# Patient Record
Sex: Female | Born: 2000
Health system: Southern US, Community
[De-identification: ages and names within clinical notes are randomized; demographics above are authoritative.]

## PROBLEM LIST (undated history)

## (undated) DIAGNOSIS — K5900 Constipation, unspecified: Secondary | ICD-10-CM

## (undated) DIAGNOSIS — L929 Granulomatous disorder of the skin and subcutaneous tissue, unspecified: Secondary | ICD-10-CM

## (undated) DIAGNOSIS — K219 Gastro-esophageal reflux disease without esophagitis: Secondary | ICD-10-CM

## (undated) HISTORY — DX: Gastro-esophageal reflux disease without esophagitis: K21.9

## (undated) HISTORY — PX: TONSILLECTOMY: SUR1361

## (undated) HISTORY — DX: Constipation, unspecified: K59.00

## (undated) HISTORY — PX: ADENOIDECTOMY: SUR15

## (undated) HISTORY — PX: TYMPANOSTOMY TUBE PLACEMENT: SHX32

---
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
## 2020-01-03 DIAGNOSIS — R4681 Obsessive-compulsive behavior: Secondary | ICD-10-CM | POA: Diagnosis not present

## 2020-01-03 DIAGNOSIS — F411 Generalized anxiety disorder: Secondary | ICD-10-CM | POA: Diagnosis not present

## 2020-01-04 ENCOUNTER — Ambulatory Visit: Payer: Self-pay | Admitting: Podiatry

## 2020-01-07 DIAGNOSIS — H527 Unspecified disorder of refraction: Secondary | ICD-10-CM | POA: Diagnosis not present

## 2020-01-08 ENCOUNTER — Encounter: Payer: Self-pay | Admitting: Psychiatry

## 2020-01-08 ENCOUNTER — Other Ambulatory Visit: Payer: Self-pay

## 2020-01-08 ENCOUNTER — Ambulatory Visit (INDEPENDENT_AMBULATORY_CARE_PROVIDER_SITE_OTHER): Payer: BC Managed Care – PPO | Admitting: Psychiatry

## 2020-01-08 VITALS — Ht 66.0 in | Wt 138.0 lb

## 2020-01-08 DIAGNOSIS — F422 Mixed obsessional thoughts and acts: Secondary | ICD-10-CM

## 2020-01-08 DIAGNOSIS — F902 Attention-deficit hyperactivity disorder, combined type: Secondary | ICD-10-CM | POA: Diagnosis not present

## 2020-01-08 DIAGNOSIS — F952 Tourette's disorder: Secondary | ICD-10-CM

## 2020-01-08 MED ORDER — QUETIAPINE FUMARATE 25 MG PO TABS: 25.0000 mg | ORAL_TABLET | Freq: Every day | ORAL | 1 refills | Status: DC

## 2020-01-08 MED ORDER — ESCITALOPRAM OXALATE 5 MG/5ML PO SOLN: 30.0000 mg | Freq: Every day | ORAL | 1 refills | Status: DC

## 2020-01-08 NOTE — Progress Notes (Addendum)
[facility]  Patient ID: Kim [name],  MRN: [id number]  PCP: Monna Fam, MD  Date of Evaluation: 01/08/2020 Time spent:35 minutes from 1650 to 1725   Chief Complaint:   HISTORY/CURRENT STATUS: Kim [name] is seen Onsite 35 minutes face-to-face conjointly with father with consent with epic collateral for 36-month evaluation and management of OCD/ADHD/Tourette reporting she may be depressed or generally anxious.  Symptoms through the past have been undermining of high school completion and rewarding social life.  Tics have been severe at times particularly facial motor tics in the past patient denies worried socially seeming to repress and suppressed rather than opening up and discussing problems for solutions.  As of last appointment she was fed up with the treatment process and high school [employer] graduation having a tough senior year with 6 courses in order to graduate.  She concluded to take a 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  Patient refused to come back earlier than 1 year taking only her Lexapro.  She was dissatisfied previously with methylphenidate in various forms, Zoloft, Adderall, Remeron, Intuniv, Strattera, and Wellbutrin finding that only the Lexapro solution was satisfactory taking 30 mg daily for OCD and that medicines either made things worse or did not help at all obviously having difficulty with change also..  She now presents with father seeming to still be debating her ability to leave in August for [employer] in [place] 1 hour outside the city patient stating she is confident that she is going to make it there but stating at the same time she feels somewhat depressed and anxious in way she cannot clarify.  Therefore, she looks to father to do something but father has OCD and finds change very difficult.   I can discuss all the potential medication approaches to complex OCD but not gain from them any comfort or confidence. We particularly realizedthat she has 2 months to get ready to be in [place] on her own and she is not realistically looking at difficulty separating from father who may find it difficult.  She has no mania, suicidality, psychosis or delirium.  She does not manifest depression in the session or more than situational generalized anxiety, but her pathology for the complex OCD is significant.  Patient had extended therapy with [name], [facility] in the past on a weekly basis and discontinued though possibly she would consider resuming if she and father could collaborate on it.   Individual Medical History/ Review of Systems: Changes? :No Weight is stable over time unchanged from 10 months ago.  Allergies: Patient has no known allergies.  Current Medications:  Current Outpatient Medications:  .  amoxicillin-clavulanate (AUGMENTIN) 400-57 MG chewable tablet, Chew 1 tablet by mouth 2 (two) times daily., Disp: 28 tablet, Rfl: 0 .  atomoxetine (STRATTERA) 25 MG capsule, Take 1 capsule (25 mg total) by mouth daily., Disp: 30 capsule, Rfl: 1 .  azelastine (ASTELIN) 0.1 % nasal spray, Place 1 spray into both nostrils 2 (two) times daily. Use in each nostril as directed, Disp: 30 mL, Rfl: 0 .  b complex vitamins tablet, Take 1 tablet by mouth daily., Disp: , Rfl:  .  brompheniramine-pseudoephedrine-DM 30-2-10 MG/5ML syrup, Take 10 mLs by mouth 3 (three) times daily as needed., Disp: 120 mL, Rfl: 0 .  doxycycline (VIBRAMYCIN) 50 MG/5ML SYRP,  Take 10 mLs (100 mg total) by mouth 2 (two) times daily., Disp: 200 mL, Rfl: 0 .  escitalopram (LEXAPRO) 5 MG/5ML solution, Take 30 mLs (30 mg total) by mouth daily after breakfast., Disp: 960 mL, Rfl: 5 .  fluorouracil (EFUDEX) 5 % cream, Apply topically 2 (two) times daily. For wart after blister and antibiotic use, Disp: 40 g, Rfl: 0 .  ondansetron  (ZOFRAN-ODT) 4 MG disintegrating tablet, TAKE 1 TABLET BY MOUTH EVERY 4-6 HOURS AS NEEDED, Disp: , Rfl: 0 .  Polyethylene Glycol 3350 (MIRALAX PO), Take by mouth., Disp: , Rfl:  .  PREVIDENT 5000 BOOSTER PLUS 1.1 % PSTE, USE 1 TO 2 TIMES A DAY FOR BRUSHING, Disp: , Rfl: 6 .  propranolol (INDERAL) 10 MG tablet, TAKE 1 TABLET BY MOUTH TWICE A DAY, Disp: 60 tablet, Rfl: 4  Medication Side Effects: none  Family Medical/ Social History: Changes? Yes father offers that he took Seroquel in the past but provides no other information about efficacy or any adverse effects.  MENTAL HEALTH EXAM:  Height 5\' 6"  (1.676 m), weight 138 lb (62.6 kg).Body mass index is 22.27 kg/m. Muscle strengths and tone 5/5, postural reflexes and gait 0/0, and AIMS = 0 with no motor tics today off all ADHD meds  General Appearance: Casual, Fairly Groomed, Guarded and Meticulous  Eye Contact:  Fair  Speech:  Clear and Coherent and Normal Rate  Volume:  Normal  Mood:  Anxious, Dysphoric, Euthymic and Irritable  Affect:  Congruent, Inappropriate, Full Range and Anxious  Thought Process:  Coherent, Goal Directed, Irrelevant, Linear and Descriptions of Associations: Circumstantial  Orientation:  Full (Time, Place, and Person)  Thought Content: Logical, Ilusions, Obsessions and Rumination   Suicidal Thoughts:  No  Homicidal Thoughts:  No  Memory:  Immediate;   Good Remote;   Good  Judgement:  Impaired to Fair  Insight:  Lacking to Fair  Psychomotor Activity:  Normal, Increased and Mannerisms  Concentration:  Concentration: Fair and Attention Span: Fair  Recall:  [employer] of Knowledge: Good  Language: Fair  Assets:  Leisure Time Resilience Talents/Skills  ADL's:  Intact  Cognition: WNL  Prognosis:  Fair    DIAGNOSES:    ICD-10-CM   1. Mixed obsessional thoughts and acts  F42.2   2. Attention deficit hyperactivity disorder (ADHD), combined type, moderate  F90.2   3. Tourette's disorder  F95.2     Receiving  Psychotherapy: No    RECOMMENDATIONS: [place] nonverbally is controlling of father's emotional response to treatment need, and father is not able to response prevent or thought control to make a family decision on the best next step for patient's difficulty getting ready for college.  Kim [name] will not answer for herself despite taking a year off being out of high school preparing for going to college stating that nothing will keep her from going when she unfortunately is building a reason for her father to not allow her to go.  We attempt to facilitate her ability to form a hierarchy of these problems that can then be solved in a graduated fashion.  Exposure desensitization thought stopping habit reversal response prevention is targeted to the patient's fixation in her preparation for going to college, her closure of household function integrated between she and father, and her independence in anticipated college social life, daily responsibilities for ADLs and learning based activities, and confidence for decision-making surrounding time and place for leisure and work.  We discuss options such as low-dose Anafranil  added to current Lexapro especially is she insists she is more depressed than can be objecitvely verified, BuSpar augmentation of Lexapro, Klonopin augmentation of Lexapro, change of Lexapro such as to Pristiq, and augmenting addition of Seroquel low-dose or a low to moderate potency dopamine blocker such as Zyprexa Zydis to the Lexapro.  With the tic disorder limiting treatment for ADHD, I suggest that Seroquel 25 mg every bedtime be started at bedtime along with continuing Lexapro 30 mg as 30 cc of the 1 mg/cc, E scribed to [facility] as a 88-month supply 2700 cc with 1 refill of Lexapro solution and #90 with 1 refill of Seroquel 25 mg tablets as she has difficulty with swallowing larger tablets for OCD/ADHD/Tourette syndrome.  I suggest returning in 6 weeks before leaving for college for any final  adjustments..  They understand prevention and monitoring safety hygiene with no crisis plans required currently though previously reviewed attempting to keep anxious confusion, disappointment over separation, and overstimulation to a minimum.  Delight Hoh, MD

## 2020-01-09 ENCOUNTER — Ambulatory Visit (INDEPENDENT_AMBULATORY_CARE_PROVIDER_SITE_OTHER): Payer: Self-pay | Admitting: Podiatry

## 2020-01-09 DIAGNOSIS — L600 Ingrowing nail: Secondary | ICD-10-CM

## 2020-01-11 ENCOUNTER — Encounter: Payer: Self-pay | Admitting: Podiatry
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
## 2020-01-17 DIAGNOSIS — R4681 Obsessive-compulsive behavior: Secondary | ICD-10-CM | POA: Diagnosis not present

## 2020-01-17 DIAGNOSIS — F411 Generalized anxiety disorder: Secondary | ICD-10-CM | POA: Diagnosis not present

## 2020-01-24 DIAGNOSIS — F411 Generalized anxiety disorder: Secondary | ICD-10-CM | POA: Diagnosis not present

## 2020-01-24 DIAGNOSIS — R4681 Obsessive-compulsive behavior: Secondary | ICD-10-CM | POA: Diagnosis not present

## 2020-02-12 ENCOUNTER — Ambulatory Visit: Payer: BC Managed Care – PPO | Admitting: Psychiatry

## 2020-02-14 DIAGNOSIS — F411 Generalized anxiety disorder: Secondary | ICD-10-CM | POA: Diagnosis not present

## 2020-02-14 DIAGNOSIS — R4681 Obsessive-compulsive behavior: Secondary | ICD-10-CM | POA: Diagnosis not present

## 2020-02-20 ENCOUNTER — Encounter: Payer: Self-pay | Admitting: Psychiatry

## 2020-02-20 ENCOUNTER — Ambulatory Visit (INDEPENDENT_AMBULATORY_CARE_PROVIDER_SITE_OTHER): Payer: BC Managed Care – PPO | Admitting: Psychiatry

## 2020-02-20 ENCOUNTER — Other Ambulatory Visit: Payer: Self-pay

## 2020-02-20 DIAGNOSIS — F902 Attention-deficit hyperactivity disorder, combined type: Secondary | ICD-10-CM

## 2020-02-20 DIAGNOSIS — F422 Mixed obsessional thoughts and acts: Secondary | ICD-10-CM

## 2020-02-20 DIAGNOSIS — F952 Tourette's disorder: Secondary | ICD-10-CM

## 2020-02-20 MED ORDER — ARIPIPRAZOLE 1 MG/ML PO SOLN: 10.0000 mg | Freq: Every day | ORAL | 1 refills | Status: DC

## 2020-02-20 NOTE — Progress Notes (Signed)
Kim [name] [id number] 06/15/2001 19 y.o.  Subjective:   Patient ID:  Kim [name] is a 19 y.o. (DOB 29-Oct-2000) female.  Chief Complaint:  Chief Complaint  Patient presents with  . Follow-up  . Anxiety  . Depression    HPI Kim [name] presents to the office today for follow-up of OCD, ADD, depression Seen with mother [name] and father [name].  Rob needing psych help but gave up his appt for daughter to be seen before going to school.  She's going to [place] to school. She has OCD, ADD, Tourette's and depression.  They wonder about other med changes. She needs liquids bc anxiety swallowing pills. Pt doesn't drive DT anxiety.  Would like help with OCD.  Hesitant about ADD meds but poor tolerance with anxiety. Contamination fears with handwashing.  Germs in general and fear of vomtiing so hard to take pills. Handwashes time hard to say but a couple of minutes.  Irritating time.   Triggers include cleaning.   She will avoid hospitals and in general doctors offices. Mo concerned OCD takes a lot of time and attention. She recognizes OCD started in middle school. Parents in support of her going to school  Depressed but not severe.  Not a direct consequence of OCD.  Feels exhausted and not motivated. Sleep about 9 hours.  Energy OK.  Not much to concentrate on.  Hard to read. ADD lack of focus is a problem on reading and is a concern for school.   Had racing heart before stimulants and they made it worse. Can get this with anxiety and get dizzy.  Leaves for school 8/31.  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  Past Psychiatric Medication Trials: History of Tourette's, ADD, OCD History of ADD brief trials Adderall XR 15, guanfacine ER 1 tired, methylphenidate, Strattera No modafinil. Lexapro 30, sertraline brief hard to swallow, mirtazapine, Wellbutrin  Review of Systems:  Review of Systems  Cardiovascular: Negative for  chest pain and palpitations.  Gastrointestinal: Positive for nausea.  Neurological: Negative for tremors and weakness.    Medications: I have reviewed the patient's current medications.  Current Outpatient Medications  Medication Sig Dispense Refill  . amoxicillin-clavulanate (AUGMENTIN) 400-57 MG chewable tablet Chew 1 tablet by mouth 2 (two) times daily. 28 tablet 0  . azelastine (ASTELIN) 0.1 % nasal spray Place 1 spray into both nostrils 2 (two) times daily. Use in each nostril as directed 30 mL 0  . b complex vitamins tablet Take 1 tablet by mouth daily.    . brompheniramine-pseudoephedrine-DM 30-2-10 MG/5ML syrup Take 10 mLs by mouth 3 (three) times daily as needed. 120 mL 0  . doxycycline (VIBRAMYCIN) 50 MG/5ML SYRP Take 10 mLs (100 mg total) by mouth 2 (two) times daily. 200 mL 0  . escitalopram (LEXAPRO) 5 MG/5ML solution Take 30 mLs (30 mg total) by mouth daily after breakfast. (Patient taking differently: Take 30 mg by mouth daily after breakfast. For a couple of years) 2700 mL 1  . fluorouracil (EFUDEX) 5 % cream Apply topically 2 (two) times daily. For wart after blister and antibiotic use 40 g 0  . ondansetron (ZOFRAN-ODT) 4 MG disintegrating tablet TAKE 1 TABLET BY MOUTH EVERY 4-6 HOURS AS NEEDED  0  . Polyethylene Glycol 3350 (MIRALAX PO) Take by mouth.    Marland Kitchen PREVIDENT 5000 BOOSTER PLUS 1.1 % PSTE USE 1 TO 2 TIMES A DAY FOR BRUSHING  6  . Aripiprazole 1 MG/ML mL Take  10 mLs (10 mg total) by mouth daily. 300 mL 1  . atomoxetine (STRATTERA) 25 MG capsule Take 1 capsule (25 mg total) by mouth daily. (Patient not taking: Reported on 02/20/2020) 30 capsule 1  . QUEtiapine (SEROQUEL) 25 MG tablet Take 1 tablet (25 mg total) by mouth at bedtime. (Patient not taking: Reported on 02/20/2020) 90 tablet 1   No current facility-administered medications for this visit.    Medication Side Effects: None  Allergies: No Known Allergies  Past Medical History:  Diagnosis Date  .  Constipation   . GERD (gastroesophageal reflux disease)   . Granuloma of skin    on forehead    Family History  Problem Relation Age of Onset  . GER disease Father   . Ulcerative colitis Father   . ADD / ADHD Father   . Anxiety disorder Father   . Depression Father   . Bipolar disorder Father   . Migraines Paternal Grandmother   . Anxiety disorder Paternal Grandmother   . Depression Paternal Grandmother   . Seizures Neg Hx   . Autism Neg Hx   . Schizophrenia Neg Hx     Social History   Socioeconomic History  . Marital status: Single    Spouse name: n/a  . Number of children: 0  . Years of education: Not on file  . Highest education level: Not on file  Occupational History  . Occupation: Ship broker  Tobacco Use  . Smoking status: Never Smoker  . Smokeless tobacco: Never Used  Vaping Use  . Vaping Use: Never used  Substance and Sexual Activity  . Alcohol use: No  . Drug use: No  . Sexual activity: Never  Other Topics Concern  . Not on file  Social History Narrative   Lives with mom, dad, brother in the same house. She is in the 11th grade at [employer]. Does well in school. She enjoys listening to music, writing and singing.    Social Determinants of Health   Financial Resource Strain:   . Difficulty of Paying Living Expenses:   Food Insecurity:   . Worried About Charity fundraiser in the Last Year:   . Arboriculturist in the Last Year:   Transportation Needs:   . Film/video editor (Medical):   Marland Kitchen Lack of Transportation (Non-Medical):   Physical Activity:   . Days of Exercise per Week:   . Minutes of Exercise per Session:   Stress:   . Feeling of Stress :   Social Connections:   . Frequency of Communication with Friends and Family:   . Frequency of Social Gatherings with Friends and Family:   . Attends Religious Services:   . Active Member of Clubs or Organizations:   . Attends Archivist Meetings:   Marland Kitchen Marital Status:   Intimate Partner  Violence:   . Fear of Current or Ex-Partner:   . Emotionally Abused:   Marland Kitchen Physically Abused:   . Sexually Abused:     Past Medical History, Surgical history, Social history, and Family history were reviewed and updated as appropriate.   Please see review of systems for further details on the patient's review from today.   Objective:   Physical Exam:  There were no vitals taken for this visit.  Physical Exam Constitutional:      General: She is not in acute distress. Musculoskeletal:        General: No deformity.  Neurological:     Mental Status: She  is alert and oriented to person, place, and time.     Coordination: Coordination normal.  Psychiatric:        Attention and Perception: Attention and perception normal. She does not perceive auditory or visual hallucinations.        Mood and Affect: Mood is anxious and depressed. Affect is not labile, blunt, angry or inappropriate.        Speech: Speech normal. Speech is not rapid and pressured.        Behavior: Behavior normal.        Thought Content: Thought content normal. Thought content is not paranoid or delusional. Thought content does not include homicidal or suicidal ideation. Thought content does not include homicidal or suicidal plan.        Cognition and Memory: Cognition and memory normal.        Judgment: Judgment normal.     Comments: Insight intact     Lab Review:  No results found for: NA, K, CL, CO2, GLUCOSE, BUN, CREATININE, CALCIUM, PROT, ALBUMIN, AST, ALT, ALKPHOS, BILITOT, GFRNONAA, GFRAA  No results found for: WBC, RBC, HGB, HCT, PLT, MCV, MCH, MCHC, RDW, LYMPHSABS, MONOABS, EOSABS, BASOSABS  No results found for: POCLITH, LITHIUM   No results found for: PHENYTOIN, PHENOBARB, VALPROATE, CBMZ   .res Assessment: Plan:    Taleshia was seen today for follow-up, anxiety and depression.  Diagnoses and all orders for this visit:  Mixed obsessional thoughts and acts -     Aripiprazole 1 MG/ML mL; Take 10 mLs  (10 mg total) by mouth daily.  Attention deficit hyperactivity disorder (ADHD), combined type, moderate  Tourette's disorder     Review Dr. Milana Huntsman notes and suggestions for options.  Review GeneSight testing dated September 30, 2017.  SOC 6 A4 was long long.  HDR 2 way was normal genotype. Pharmacokinetic genes included normal 2D6, 3 A4, 2 C9, 2C 19, 1A2, and intermediate 2B 6  Aripiprazole 10 mg solution  queetiapine for sleep ] Discussed potential metabolic side effects associated with atypical antipsychotics, as well as potential risk for movement side effects. Advised pt to contact office if movement side effects occur.   Continue Lexapro 30.  Disc SE and alternative SSRI for OCD and their se.  Fu 6 weeks  Lynder Parents, MD, DFAPA  Please see After Visit Summary for patient specific instructions.  No future appointments.  No orders of the defined types were placed in this encounter.   -------------------------------

## 2020-02-20 NOTE — Patient Instructions (Signed)
Book Kim Dings PhD, Getting Control

## 2020-02-21 DIAGNOSIS — R4681 Obsessive-compulsive behavior: Secondary | ICD-10-CM | POA: Diagnosis not present

## 2020-02-21 DIAGNOSIS — F411 Generalized anxiety disorder: Secondary | ICD-10-CM | POA: Diagnosis not present

## 2020-02-28 DIAGNOSIS — F411 Generalized anxiety disorder: Secondary | ICD-10-CM | POA: Diagnosis not present

## 2020-02-28 DIAGNOSIS — R4681 Obsessive-compulsive behavior: Secondary | ICD-10-CM | POA: Diagnosis not present

## 2020-03-06 DIAGNOSIS — F411 Generalized anxiety disorder: Secondary | ICD-10-CM | POA: Diagnosis not present

## 2020-03-06 DIAGNOSIS — R4681 Obsessive-compulsive behavior: Secondary | ICD-10-CM | POA: Diagnosis not present

## 2020-03-07 ENCOUNTER — Encounter: Payer: Self-pay | Admitting: Psychiatry

## 2020-03-07 ENCOUNTER — Telehealth (INDEPENDENT_AMBULATORY_CARE_PROVIDER_SITE_OTHER): Payer: BC Managed Care – PPO | Admitting: Psychiatry

## 2020-03-07 DIAGNOSIS — F422 Mixed obsessional thoughts and acts: Secondary | ICD-10-CM

## 2020-03-07 DIAGNOSIS — F952 Tourette's disorder: Secondary | ICD-10-CM

## 2020-03-07 DIAGNOSIS — F902 Attention-deficit hyperactivity disorder, combined type: Secondary | ICD-10-CM | POA: Diagnosis not present

## 2020-03-07 NOTE — Progress Notes (Signed)
Kim [name] [id number] May 09, 2001 19 y.o.  Video Visit via My Chart  I connected with pt by My Chart and verified that I am speaking with the correct person using two identifiers.   I discussed the limitations, risks, security and privacy concerns of performing an evaluation and management service by My Chart  and the availability of in person appointments. I also discussed with the patient that there may be a patient responsible charge related to this service. The patient expressed understanding and agreed to proceed.  I discussed the assessment and treatment plan with the patient. The patient was provided an opportunity to ask questions and all were answered. The patient agreed with the plan and demonstrated an understanding of the instructions.   The patient was advised to call back or seek an in-person evaluation if the symptoms worsen or if the condition fails to improve as anticipated.  I provided 30 minutes of video time during this encounter.  The patient was located at home and the provider was located office. Call started 3:00 and ended 3:30  Subjective:   Patient ID:  Kim [name] is a 19 y.o. (DOB [birth year]) female.  Chief Complaint:  Chief Complaint  Patient presents with  . Follow-up    Medication management  . Other    ocd    HPI Kim [name] presents today for follow-up of OCD, ADD, depression  02/20/20 appt with the following noted: Seen with mother Kim [name] and father Kim [name].  Rob needing psych help but gave up his appt for daughter to be seen before going to school.  She's going to [place] to school. She has OCD, ADD, Tourette's and depression.  They wonder about other med changes. She needs liquids bc anxiety swallowing pills. Pt doesn't drive DT anxiety.  Would like help with OCD.  Hesitant about ADD meds but poor tolerance with anxiety. Contamination fears with handwashing.  Germs in general and fear of vomtiing so hard to take pills. Handwashes time  hard to say but a couple of minutes.  Irritating time.   Triggers include cleaning.   She will avoid hospitals and in general doctors offices. Mo concerned OCD takes a lot of time and attention. She recognizes OCD started in middle school. Parents in support of her going to school  Depressed but not severe.  Not a direct consequence of OCD.  Feels exhausted and not motivated. Sleep about 9 hours.  Energy OK.  Not much to concentrate on.  Hard to read. ADD lack of focus is a problem on reading and is a concern for school.   Had racing heart before stimulants and they made it worse. Can get this with anxiety and get dizzy.  Leaves for school 8/31. [employer] first year.  In [place].    03/07/20 appt with the following noted: Abilify 5 definitely helped with depression. Dep 3/10.   More active.  Better productivity.  Better mood. Some difference with OCD.  Not as big improvement as with depression but better.  Not sure about time spent with OCD.  Anxiety generally 5/10/  Worst times 8/10.   Biggest concern about OCD and school is the potential time involved.  Went time to do school work sometimes hard to resist OCD.  Not very concerned about OCD affecting her socially.   SE: Some hunger at times.  Restlessness fine now.    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  Past Psychiatric Medication Trials: History of  Tourette's, ADD, OCD History of ADD brief trials Adderall XR 15, guanfacine ER 1 tired, methylphenidate, Strattera No modafinil. Lexapro 30, sertraline brief hard to swallow, mirtazapine, Wellbutrin  Review of Systems:  Review of Systems  Gastrointestinal: Positive for nausea.  Neurological: Negative for tremors and weakness.    Medications: I have reviewed the patient's current medications.  Current Outpatient Medications  Medication Sig Dispense Refill  . Aripiprazole 1 MG/ML mL Take 10 mLs (10 mg total) by mouth daily.  (Patient taking differently: Take 5 mg by mouth daily. ) 300 mL 1  . escitalopram (LEXAPRO) 5 MG/5ML solution Take 30 mLs (30 mg total) by mouth daily after breakfast. (Patient taking differently: Take 30 mg by mouth daily after breakfast. For a couple of years) 2700 mL 1  . ZOFRAN 4 MG tablet Take by mouth.     No current facility-administered medications for this visit.    Medication Side Effects: None  Allergies: No Known Allergies  Past Medical History:  Diagnosis Date  . Constipation   . GERD (gastroesophageal reflux disease)   . Granuloma of skin    on forehead    Family History  Problem Relation Age of Onset  . GER disease Father   . Ulcerative colitis Father   . ADD / ADHD Father   . Anxiety disorder Father   . Depression Father   . Bipolar disorder Father   . Migraines Paternal Grandmother   . Anxiety disorder Paternal Grandmother   . Depression Paternal Grandmother   . Seizures Neg Hx   . Autism Neg Hx   . Schizophrenia Neg Hx     Social History   Socioeconomic History  . Marital status: Single    Spouse name: n/a  . Number of children: 0  . Years of education: Not on file  . Highest education level: Not on file  Occupational History  . Occupation: Ship broker  Tobacco Use  . Smoking status: Never Smoker  . Smokeless tobacco: Never Used  Vaping Use  . Vaping Use: Never used  Substance and Sexual Activity  . Alcohol use: No  . Drug use: No  . Sexual activity: Never  Other Topics Concern  . Not on file  Social History Narrative   Lives with mom, dad, brother in the same house. She is in the 11th grade at [employer]. Does well in school. She enjoys listening to music, writing and singing.    Social Determinants of Health   Financial Resource Strain:   . Difficulty of Paying Living Expenses: Not on file  Food Insecurity:   . Worried About Charity fundraiser in the Last Year: Not on file  . Ran Out of Food in the Last Year: Not on file   Transportation Needs:   . Lack of Transportation (Medical): Not on file  . Lack of Transportation (Non-Medical): Not on file  Physical Activity:   . Days of Exercise per Week: Not on file  . Minutes of Exercise per Session: Not on file  Stress:   . Feeling of Stress : Not on file  Social Connections:   . Frequency of Communication with Friends and Family: Not on file  . Frequency of Social Gatherings with Friends and Family: Not on file  . Attends Religious Services: Not on file  . Active Member of Clubs or Organizations: Not on file  . Attends Archivist Meetings: Not on file  . Marital Status: Not on file  Intimate Partner Violence:   .  Fear of Current or Ex-Partner: Not on file  . Emotionally Abused: Not on file  . Physically Abused: Not on file  . Sexually Abused: Not on file    Past Medical History, Surgical history, Social history, and Family history were reviewed and updated as appropriate.   Please see review of systems for further details on the patient's review from today.   Objective:   Physical Exam:  There were no vitals taken for this visit.  Physical Exam Constitutional:      General: She is not in acute distress. Musculoskeletal:        General: No deformity.  Neurological:     Mental Status: She is alert and oriented to person, place, and time.     Coordination: Coordination normal.  Psychiatric:        Attention and Perception: Attention and perception normal. She does not perceive auditory or visual hallucinations.        Mood and Affect: Mood is anxious and depressed. Affect is not labile, blunt, angry or inappropriate.        Speech: Speech normal.        Behavior: Behavior normal.        Thought Content: Thought content normal. Thought content is not paranoid or delusional. Thought content does not include homicidal or suicidal ideation. Thought content does not include homicidal or suicidal plan.        Cognition and Memory: Cognition  and memory normal.        Judgment: Judgment normal.     Comments: Insight intact     Lab Review:  No results found for: NA, K, CL, CO2, GLUCOSE, BUN, CREATININE, CALCIUM, PROT, ALBUMIN, AST, ALT, ALKPHOS, BILITOT, GFRNONAA, GFRAA  No results found for: WBC, RBC, HGB, HCT, PLT, MCV, MCH, MCHC, RDW, LYMPHSABS, MONOABS, EOSABS, BASOSABS  No results found for: POCLITH, LITHIUM   No results found for: PHENYTOIN, PHENOBARB, VALPROATE, CBMZ   .res Assessment: Plan:    Amalee was seen today for follow-up and other.  Diagnoses and all orders for this visit:  Mixed obsessional thoughts and acts  Attention deficit hyperactivity disorder (ADHD), combined type, moderate  Tourette's disorder     Greater than 50% of 30 min face to face time with patient was spent on counseling and coordination of care. We discussed Review Dr. Milana Huntsman notes and suggestions for options.  Review GeneSight testing dated September 30, 2017.  SOC 6 A4 was long long.  HDR 2 way was normal genotype. Pharmacokinetic genes included normal 2D6, 3 A4, 2 C9, 2C 19, 1A2, and intermediate 2B 6  Continue Lexapro at current dose  Increase Abilify to 7-8 mg now and if improved then no higher. If no improvement then increase to 10 mg daily.  Discussed potential metabolic side effects associated with atypical antipsychotics, as well as potential risk for movement side effects. Advised pt to contact office if movement side effects occur.   Disc counseling  FU 6 weeks.  Lynder Parents, MD, DFAPA  Please see After Visit Summary for patient specific instructions.  No future appointments.  No orders of the defined types were placed in this encounter.   -------------------------------

## 2020-03-11 ENCOUNTER — Encounter: Payer: Self-pay | Admitting: Psychiatry

## 2020-03-12 ENCOUNTER — Other Ambulatory Visit: Payer: Self-pay | Admitting: Psychiatry

## 2020-03-12 DIAGNOSIS — F422 Mixed obsessional thoughts and acts: Secondary | ICD-10-CM

## 2020-03-12 NOTE — Telephone Encounter (Signed)
90 day request

## 2020-03-18 DIAGNOSIS — R4681 Obsessive-compulsive behavior: Secondary | ICD-10-CM | POA: Diagnosis not present

## 2020-03-18 DIAGNOSIS — F411 Generalized anxiety disorder: Secondary | ICD-10-CM | POA: Diagnosis not present

## 2020-03-25 DIAGNOSIS — F411 Generalized anxiety disorder: Secondary | ICD-10-CM | POA: Diagnosis not present

## 2020-03-25 DIAGNOSIS — R4681 Obsessive-compulsive behavior: Secondary | ICD-10-CM | POA: Diagnosis not present

## 2020-04-16 ENCOUNTER — Other Ambulatory Visit: Payer: Self-pay

## 2020-04-16 ENCOUNTER — Telehealth: Payer: Self-pay | Admitting: Psychiatry

## 2020-04-16 DIAGNOSIS — F422 Mixed obsessional thoughts and acts: Secondary | ICD-10-CM

## 2020-04-16 NOTE — Telephone Encounter (Signed)
Kim [name]'s dad, Rob, called to get refills on [place]. They are Lexapro and Abilify. They need called to [place] at [phone]. Last appt was 03/07/20 and she was to return in 6 weeks. The dad said she is in college now and will make an appt soon.

## 2020-04-16 NOTE — Telephone Encounter (Signed)
Updated Pharmacy and pended for Dr. Clovis Pu to confirm dose

## 2020-04-17 MED ORDER — ARIPIPRAZOLE 1 MG/ML PO SOLN: 10.0000 mg | Freq: Every day | ORAL | 0 refills | Status: DC

## 2020-04-17 MED ORDER — ESCITALOPRAM OXALATE 5 MG/5ML PO SOLN: 30.0000 mg | Freq: Every day | ORAL | 0 refills | Status: DC

## 2020-04-20 ENCOUNTER — Other Ambulatory Visit: Payer: Self-pay | Admitting: Psychiatry

## 2020-04-20 DIAGNOSIS — F422 Mixed obsessional thoughts and acts: Secondary | ICD-10-CM

## 2020-04-30 ENCOUNTER — Encounter: Payer: Self-pay | Admitting: Psychiatry

## 2020-05-21 ENCOUNTER — Other Ambulatory Visit: Payer: Self-pay | Admitting: Psychiatry

## 2020-05-21 DIAGNOSIS — F422 Mixed obsessional thoughts and acts: Secondary | ICD-10-CM

## 2020-05-22 ENCOUNTER — Other Ambulatory Visit: Payer: Self-pay | Admitting: Psychiatry

## 2020-05-22 DIAGNOSIS — F422 Mixed obsessional thoughts and acts: Secondary | ICD-10-CM

## 2020-05-27 DIAGNOSIS — R4681 Obsessive-compulsive behavior: Secondary | ICD-10-CM | POA: Diagnosis not present

## 2020-05-27 DIAGNOSIS — F411 Generalized anxiety disorder: Secondary | ICD-10-CM | POA: Diagnosis not present

## 2020-06-10 DIAGNOSIS — F411 Generalized anxiety disorder: Secondary | ICD-10-CM | POA: Diagnosis not present

## 2020-06-10 DIAGNOSIS — R4681 Obsessive-compulsive behavior: Secondary | ICD-10-CM | POA: Diagnosis not present

## 2020-06-17 DIAGNOSIS — R4681 Obsessive-compulsive behavior: Secondary | ICD-10-CM | POA: Diagnosis not present

## 2020-06-17 DIAGNOSIS — F411 Generalized anxiety disorder: Secondary | ICD-10-CM | POA: Diagnosis not present

## 2020-06-25 ENCOUNTER — Telehealth (INDEPENDENT_AMBULATORY_CARE_PROVIDER_SITE_OTHER): Payer: BC Managed Care – PPO | Admitting: Psychiatry

## 2020-06-25 ENCOUNTER — Encounter: Payer: Self-pay | Admitting: Psychiatry

## 2020-06-25 DIAGNOSIS — F902 Attention-deficit hyperactivity disorder, combined type: Secondary | ICD-10-CM

## 2020-06-25 DIAGNOSIS — F422 Mixed obsessional thoughts and acts: Secondary | ICD-10-CM

## 2020-06-25 DIAGNOSIS — G901 Familial dysautonomia [Riley-Day]: Secondary | ICD-10-CM | POA: Diagnosis not present

## 2020-06-25 DIAGNOSIS — F952 Tourette's disorder: Secondary | ICD-10-CM | POA: Diagnosis not present

## 2020-06-25 NOTE — Progress Notes (Addendum)
Kim [name] [id number] July 01, 2001 19 y.o.  Video Visit via My Chart  I connected with pt by My Chart and verified that I am speaking with the correct person using two identifiers.   I discussed the limitations, risks, security and privacy concerns of performing an evaluation and management service by My Chart  and the availability of in person appointments. I also discussed with the patient that there may be a patient responsible charge related to this service. The patient expressed understanding and agreed to proceed.  I discussed the assessment and treatment plan with the patient. The patient was provided an opportunity to ask questions and all were answered. The patient agreed with the plan and demonstrated an understanding of the instructions.   The patient was advised to call back or seek an in-person evaluation if the symptoms worsen or if the condition fails to improve as anticipated.  I provided 30 minutes of video time during this encounter.  The patient was located at home and the provider was located office. Call started 3:00 and ended 3:30  Subjective:   Patient ID:  Kim [name] is a 19 y.o. (DOB February 03, 2001) female.  Chief Complaint:  Chief Complaint  Patient presents with  . Follow-up  . Depression  . Anxiety    HPI Kim [name] presents today for follow-up of OCD, ADD, depression  02/20/20 appt with the following noted: Seen with mother Kim [name] and father Kim [name].  Rob needing psych help but gave up his appt for daughter to be seen before going to school.  She's going to [place] to school. She has OCD, ADD, Tourette's and depression.  They wonder about other med changes. She needs liquids bc anxiety swallowing pills. Pt doesn't drive DT anxiety.  Would like help with OCD.  Hesitant about ADD meds but poor tolerance with anxiety. Contamination fears with handwashing.  Germs in general and fear of vomtiing so hard to take pills. Handwashes time hard to say but  a couple of minutes.  Irritating time.   Triggers include cleaning.   She will avoid hospitals and in general doctors offices. Mo concerned OCD takes a lot of time and attention. She recognizes OCD started in middle school. Parents in support of her going to school  Depressed but not severe.  Not a direct consequence of OCD.  Feels exhausted and not motivated. Sleep about 9 hours.  Energy OK.  Not much to concentrate on.  Hard to read. ADD lack of focus is a problem on reading and is a concern for school.   Had racing heart before stimulants and they made it worse. Can get this with anxiety and get dizzy.  Leaves for school 8/31. [place] [employer] first year.  In [place].    03/07/20 appt with the following noted: Abilify 5 definitely helped with depression. Dep 3/10.   More active.  Better productivity.  Better mood. Some difference with OCD.  Not as big improvement as with depression but better.  Not sure about time spent with OCD.  Anxiety generally 5/10/  Worst times 8/10.   Biggest concern about OCD and school is the potential time involved.  Went time to do school work sometimes hard to resist OCD.  Not very concerned about OCD affecting her socially.   SE: Some hunger at times.  Restlessness fine now.   Plan: Continue Lexapro at current dose Increase Abilify to 7-8 mg now and if improved then no higher. If no improvement then increase to 10 mg daily.  06/25/2020 appointment with the following noted: Mostly good but not much improvement with OCD.  Depression changing like usual.  Weather affects her and other factors.  History of seasonal depression.  More tired and sleep more.  Annoying but not a huge deal.   Increase in Abilify without sig change.   Finishes exams Thursday and break for a month. No SE.   Able to enjoy things. Handwashing 5 min each time she washes them.  Hard to know how much total time. Tremor before Lexapro.     Therapist [name], Water engineer. 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  GF OCD.  Past Psychiatric Medication Trials: History of Tourette's, ADD, OCD History of ADD brief trials Adderall XR 15, guanfacine ER 1 tired, methylphenidate, Strattera No modafinil. Lexapro 30, sertraline liquid brief hard to swallow,  mirtazapine, Wellbutrin  Review of Systems:  Review of Systems  Cardiovascular: Negative for palpitations.  Gastrointestinal: Positive for nausea.  Neurological: Positive for tremors. Negative for weakness.    Medications: I have reviewed the patient's current medications.  Current Outpatient Medications  Medication Sig Dispense Refill  . Aripiprazole 1 MG/ML mL TAKE 79ml BY MOUTH EVERY DAY 300 mL 0  . escitalopram (LEXAPRO) 5 MG/5ML solution TAKE 76ml BY MOUTH EVERY DAY AFTER breakfast 900 mL 0  . ZOFRAN 4 MG tablet Take by mouth.     No current facility-administered medications for this visit.    Medication Side Effects: None  Allergies: No Known Allergies  Past Medical History:  Diagnosis Date  . Constipation   . GERD (gastroesophageal reflux disease)   . Granuloma of skin    on forehead    Family History  Problem Relation Age of Onset  . GER disease Father   . Ulcerative colitis Father   . ADD / ADHD Father   . Anxiety disorder Father   . Depression Father   . Bipolar disorder Father   . Migraines Paternal Grandmother   . Anxiety disorder Paternal Grandmother   . Depression Paternal Grandmother   . Seizures Neg Hx   . Autism Neg Hx   . Schizophrenia Neg Hx     Social History   Socioeconomic History  . Marital status: Single    Spouse name: n/a  . Number of children: 0  . Years of education: Not on file  . Highest education level: Not on file  Occupational History  . Occupation: Ship broker  Tobacco Use  . Smoking status: Never Smoker  . Smokeless tobacco: Never Used  Vaping Use  . Vaping Use: Never used  Substance and Sexual Activity  . Alcohol use: No  . Drug  use: No  . Sexual activity: Never  Other Topics Concern  . Not on file  Social History Narrative   Lives with mom, dad, brother in the same house. She is in the 11th grade at [employer]. Does well in school. She enjoys listening to music, writing and singing.    Social Determinants of Health   Financial Resource Strain: Not on file  Food Insecurity: Not on file  Transportation Needs: Not on file  Physical Activity: Not on file  Stress: Not on file  Social Connections: Not on file  Intimate Partner Violence: Not on file    Past Medical History, Surgical history, Social history, and Family history were reviewed and updated as appropriate.   Please see review of systems for further details on the patient's review from today.   Objective:   Physical Exam:  There were no vitals taken for this visit.  Physical Exam Neurological:     Mental Status: She is alert and oriented to person, place, and time.     Cranial Nerves: No dysarthria.  Psychiatric:        Attention and Perception: Attention and perception normal.        Mood and Affect: Mood is anxious and depressed.        Speech: Speech normal.        Behavior: Behavior is cooperative.        Thought Content: Thought content normal. Thought content is not paranoid or delusional. Thought content does not include homicidal or suicidal ideation. Thought content does not include homicidal or suicidal plan.        Cognition and Memory: Cognition and memory normal.        Judgment: Judgment normal.     Comments: Insight intact OCD ongoing. She tends to be quiet.  She does provide adequate details.     Lab Review:  No results found for: NA, K, CL, CO2, GLUCOSE, BUN, CREATININE, CALCIUM, PROT, ALBUMIN, AST, ALT, ALKPHOS, BILITOT, GFRNONAA, GFRAA  No results found for: WBC, RBC, HGB, HCT, PLT, MCV, MCH, MCHC, RDW, LYMPHSABS, MONOABS, EOSABS, BASOSABS  No results found for: POCLITH, LITHIUM   No results found for: PHENYTOIN,  PHENOBARB, VALPROATE, CBMZ   .res Assessment: Plan:    Yuktha was seen today for follow-up, depression and anxiety.  Diagnoses and all orders for this visit:  Mixed obsessional thoughts and acts  Attention deficit hyperactivity disorder (ADHD), combined type, moderate  Tourette's disorder  Dysautonomia ([place])     Greater than 50% of 30 min face to face time with patient was spent on counseling and coordination of care. We discussed Review Dr. Milana Huntsman notes and suggestions for options.  Review GeneSight testing dated September 30, 2017.  SLC 6 A4 was long long.  HDR2A was normal genotype. Pharmacokinetic genes included normal 2D6, 3 A4, 2 C9, 2C19, 1A2, and intermediate 2B 6  Continue Lexapro but increase to 35 mg daily for 2 weeks, then 40 mg daily. High dose medically necessary.  Needs liquid and couldn't tolerate the sertraline. Discussed that this is a high dose and discussed serotonergic side effects and serotonin syndrome.  Coffee worsens tremor discussed  Asks for Zofran prn.  Okay she will let us know what pharmacy to send to.  Option lamotrigine for SAD and off label OCD.  Also discussed that Zofran can be used off label to potentiate OCD treatment but that is not what were trying to do at this time.  Just asked to have Zofran to use occasionally for nausea because she does not have a PCP in her area.  She will be returning to [place] this month for Christmas break  After exams drop Abilify back to 5 mg bc not better with increase to 10 mg. Discussed potential metabolic side effects associated with atypical antipsychotics, as well as potential risk for movement side effects. Advised pt to contact office if movement side effects occur.   Increase vitamin D 4000-5000 units daily until April 1.  Disc counseling  FU 8 weeks.  Lynder Parents, MD, DFAPA  Please see After Visit Summary for patient specific instructions.  No future appointments.  No orders of the  defined types were placed in this encounter.   -------------------------------

## 2020-06-30 ENCOUNTER — Telehealth: Payer: Self-pay | Admitting: Psychiatry

## 2020-06-30 ENCOUNTER — Other Ambulatory Visit: Payer: Self-pay | Admitting: Psychiatry

## 2020-06-30 DIAGNOSIS — F422 Mixed obsessional thoughts and acts: Secondary | ICD-10-CM

## 2020-06-30 MED ORDER — ZOFRAN 4 MG PO TABS: 4.0000 mg | ORAL_TABLET | Freq: Three times a day (TID) | ORAL | 1 refills | Status: DC | PRN

## 2020-06-30 MED ORDER — ESCITALOPRAM OXALATE 5 MG/5ML PO SOLN: ORAL | 0 refills | Status: DC

## 2020-06-30 NOTE — Telephone Encounter (Signed)
Pt had a visit last week and is waiting for the lexapro and zofran refills to be sent to the pharmacy [facility] on [address]

## 2020-07-02 DIAGNOSIS — R4681 Obsessive-compulsive behavior: Secondary | ICD-10-CM | POA: Diagnosis not present

## 2020-07-02 DIAGNOSIS — F411 Generalized anxiety disorder: Secondary | ICD-10-CM | POA: Diagnosis not present

## 2020-07-03 ENCOUNTER — Other Ambulatory Visit: Payer: Self-pay | Admitting: Psychiatry

## 2020-07-10 DIAGNOSIS — F411 Generalized anxiety disorder: Secondary | ICD-10-CM | POA: Diagnosis not present

## 2020-07-10 DIAGNOSIS — R4681 Obsessive-compulsive behavior: Secondary | ICD-10-CM | POA: Diagnosis not present

## 2020-07-16 ENCOUNTER — Other Ambulatory Visit: Payer: Self-pay

## 2020-07-16 ENCOUNTER — Ambulatory Visit (INDEPENDENT_AMBULATORY_CARE_PROVIDER_SITE_OTHER): Payer: Self-pay | Admitting: Podiatry

## 2020-07-16 DIAGNOSIS — L600 Ingrowing nail: Secondary | ICD-10-CM

## 2020-07-16 DIAGNOSIS — R4681 Obsessive-compulsive behavior: Secondary | ICD-10-CM | POA: Diagnosis not present

## 2020-07-16 DIAGNOSIS — L03031 Cellulitis of right toe: Secondary | ICD-10-CM

## 2020-07-16 DIAGNOSIS — F411 Generalized anxiety disorder: Secondary | ICD-10-CM | POA: Diagnosis not present

## 2020-07-17 ENCOUNTER — Telehealth: Payer: Self-pay | Admitting: Podiatry

## 2020-07-17 ENCOUNTER — Encounter: Payer: Self-pay | Admitting: Podiatry

## 2020-07-17 MED ORDER — DOXYCYCLINE HYCLATE 100 MG PO TABS: 100.0000 mg | ORAL_TABLET | Freq: Two times a day (BID) | ORAL | 0 refills | Status: DC

## 2020-07-17 NOTE — Telephone Encounter (Signed)
Patient father called and stated that Dr. Allena Katz was suppose to send Doxycycline to [facility] on [address]. Pt was seen yesterday

## 2020-07-17 NOTE — Progress Notes (Signed)
Subjective:  Patient ID: Kim [name], female    DOB: [birth year],  MRN: [id number]  Chief Complaint  Patient presents with  . Ingrown Toenail    Right hallux ingrown nail.     20 y.o. female presents with the above complaint.  Patient presents with right hallux lateral ingrown returns.  Patient states that it started to hurt again and she did not do Epsom salt soaks last time.  She states this continues to be annoying.  There are some thickening of the skin.  There is mild pain but you not really much pain.  She denies any other acute complaints.  She would like to have it removed again if needed.   Review of Systems: Negative except as noted in the HPI. Denies N/V/F/Ch.  Past Medical History:  Diagnosis Date  . Constipation   . GERD (gastroesophageal reflux disease)   . Granuloma of skin    on forehead    Current Outpatient Medications:  .  Aripiprazole 1 MG/ML mL, TAKE 45ml BY MOUTH EVERY DAY, Disp: 300 mL, Rfl: 0 .  escitalopram (LEXAPRO) 5 MG/5ML solution, TAKE 67ml BY MOUTH EVERY DAY AFTER breakfast, Disp: 900 mL, Rfl: 0 .  escitalopram (LEXAPRO) 5 MG/5ML solution, 5 ml, Disp: , Rfl:  .  ondansetron (ZOFRAN) 4 MG tablet, TAKE 1 TABLET BY MOUTH EVERY 8 HOURS AS NEEDED FOR NAUSEA AND VOMITING, Disp: 20 tablet, Rfl: 1 .  ondansetron (ZOFRAN-ODT) 4 MG disintegrating tablet, DISSOLVE 1 TABLET BY MOUTH ON TONGUE EVERY DAY, Disp: , Rfl:   Social History   Tobacco Use  Smoking Status Never Smoker  Smokeless Tobacco Never Used    No Known Allergies Objective:  There were no vitals filed for this visit. There is no height or weight on file to calculate BMI. Constitutional Well developed. Well nourished.  Vascular Dorsalis pedis pulses palpable bilaterally. Posterior tibial pulses palpable bilaterally. Capillary refill normal to all digits.  No cyanosis or clubbing noted. Pedal hair growth normal.  Neurologic Normal speech. Oriented to person, place, and time. Epicritic  sensation to light touch grossly present bilaterally.  Dermatologic Painful ingrowing nail at lateral nail borders of the hallux nail right. No other open wounds. No skin lesions.  Orthopedic: Normal joint ROM without pain or crepitus bilaterally. No visible deformities. No bony tenderness.   Radiographs: None Assessment:   1. Ingrown toenail of right foot   2. Paronychia of toe of right foot due to ingrown toenail    Plan:  Patient was evaluated and treated and all questions answered.  Ingrown Nail, right -Patient elects to proceed with minor surgery to remove ingrown toenail removal today. Consent reviewed and signed by patient. -Ingrown nail excised. See procedure note. -Educated on post-procedure care including soaking. Written instructions provided and reviewed. -Patient to follow up in 2 weeks for nail check. -Doxycycline was discussed for skin or soft tissue prophylaxis  Procedure: Excision of Ingrown Toenail Location: Right 1st toe lateral nail borders. Anesthesia: Lidocaine 1% plain; 1.5 mL and Marcaine 0.5% plain; 1.5 mL, digital block. Skin Prep: Betadine. Dressing: Silvadene; telfa; dry, sterile, compression dressing. Technique: Following skin prep, the toe was exsanguinated and a tourniquet was secured at the base of the toe. The affected nail border was freed, split with a nail splitter, and excised. Chemical matrixectomy was then performed with phenol and irrigated out with alcohol. The tourniquet was then removed and sterile dressing applied. Disposition: Patient tolerated procedure well. Patient to return in 2 weeks for follow-up.  No follow-ups on file.

## 2020-07-17 NOTE — Telephone Encounter (Signed)
done

## 2020-07-17 NOTE — Addendum Note (Signed)
Addended by: Nicholes Rough on: 07/17/2020 02:03 PM   Modules accepted: Orders

## 2020-07-22 DIAGNOSIS — F411 Generalized anxiety disorder: Secondary | ICD-10-CM | POA: Diagnosis not present

## 2020-07-22 DIAGNOSIS — R4681 Obsessive-compulsive behavior: Secondary | ICD-10-CM | POA: Diagnosis not present

## 2020-07-23 ENCOUNTER — Other Ambulatory Visit: Payer: Self-pay | Admitting: Psychiatry

## 2020-07-23 DIAGNOSIS — F422 Mixed obsessional thoughts and acts: Secondary | ICD-10-CM

## 2020-07-30 ENCOUNTER — Ambulatory Visit: Payer: Self-pay | Admitting: Podiatry

## 2020-08-12 ENCOUNTER — Telehealth: Payer: Self-pay | Admitting: Psychiatry

## 2020-08-12 NOTE — Telephone Encounter (Signed)
Rtc to patient and advised her to add Abilify 5 mg and keep Lexapro the same. Call back if symptoms not improving.

## 2020-08-12 NOTE — Telephone Encounter (Signed)
Next visit is 09/11/20. Kim [name] was taken off of Abilify and had her Lexapro increased. Since doing this she is feeling worse, more depressed and lack of energy. She would like to know what she needs to do to help with this. Her number is [phone].

## 2020-08-12 NOTE — Telephone Encounter (Signed)
Her last appointment was 06/25/2020.  Prior to that appointment we had increased Abilify from 5 mg to 10 mg without additional improvement.  Therefore the instructions were to reduce the Abilify back to 5 mg daily.  She was not instructed to discontinue it.  Because the increase in Abilify had not been helpful from 5 to 10 mg, in addition to reducing the Abilify back to 5 mg she was to increase the Lexapro from 30 mg to 40 mg.  It is unlikely that increasing Lexapro would make her more depressed.  I think the depression is worse because she stopped the Abilify apparently.  The increase in Lexapro could possibly make her more fatigued but worsening depression could be causing the fatigue.  The benefit from restarting Abilify 5 mg daily should be quick, that is benefit within a week. Therefore I suggest she continue the current dose of Lexapro and add 5 mg of Abilify back.  The depression should improve within a week or so.  If she is not better by February 10 then call us back

## 2020-08-15 DIAGNOSIS — R4681 Obsessive-compulsive behavior: Secondary | ICD-10-CM | POA: Diagnosis not present

## 2020-08-15 DIAGNOSIS — F411 Generalized anxiety disorder: Secondary | ICD-10-CM | POA: Diagnosis not present

## 2020-08-20 DIAGNOSIS — R4681 Obsessive-compulsive behavior: Secondary | ICD-10-CM | POA: Diagnosis not present

## 2020-08-20 DIAGNOSIS — F411 Generalized anxiety disorder: Secondary | ICD-10-CM | POA: Diagnosis not present

## 2020-08-22 ENCOUNTER — Other Ambulatory Visit: Payer: Self-pay | Admitting: Psychiatry

## 2020-08-22 DIAGNOSIS — F422 Mixed obsessional thoughts and acts: Secondary | ICD-10-CM

## 2020-08-27 DIAGNOSIS — R4681 Obsessive-compulsive behavior: Secondary | ICD-10-CM | POA: Diagnosis not present

## 2020-08-27 DIAGNOSIS — F411 Generalized anxiety disorder: Secondary | ICD-10-CM | POA: Diagnosis not present

## 2020-09-03 DIAGNOSIS — F411 Generalized anxiety disorder: Secondary | ICD-10-CM | POA: Diagnosis not present

## 2020-09-03 DIAGNOSIS — R4681 Obsessive-compulsive behavior: Secondary | ICD-10-CM | POA: Diagnosis not present

## 2020-09-10 DIAGNOSIS — F411 Generalized anxiety disorder: Secondary | ICD-10-CM | POA: Diagnosis not present

## 2020-09-10 DIAGNOSIS — R4681 Obsessive-compulsive behavior: Secondary | ICD-10-CM | POA: Diagnosis not present

## 2020-09-11 ENCOUNTER — Ambulatory Visit: Payer: BC Managed Care – PPO | Admitting: Psychiatry

## 2020-09-16 ENCOUNTER — Other Ambulatory Visit: Payer: Self-pay | Admitting: Psychiatry

## 2020-09-16 DIAGNOSIS — F422 Mixed obsessional thoughts and acts: Secondary | ICD-10-CM

## 2020-09-17 DIAGNOSIS — R4681 Obsessive-compulsive behavior: Secondary | ICD-10-CM | POA: Diagnosis not present

## 2020-09-17 DIAGNOSIS — F411 Generalized anxiety disorder: Secondary | ICD-10-CM | POA: Diagnosis not present

## 2020-10-08 DIAGNOSIS — R4681 Obsessive-compulsive behavior: Secondary | ICD-10-CM | POA: Diagnosis not present

## 2020-10-08 DIAGNOSIS — F411 Generalized anxiety disorder: Secondary | ICD-10-CM | POA: Diagnosis not present

## 2020-10-15 DIAGNOSIS — F411 Generalized anxiety disorder: Secondary | ICD-10-CM | POA: Diagnosis not present

## 2020-10-15 DIAGNOSIS — R4681 Obsessive-compulsive behavior: Secondary | ICD-10-CM | POA: Diagnosis not present

## 2020-10-22 DIAGNOSIS — R4681 Obsessive-compulsive behavior: Secondary | ICD-10-CM | POA: Diagnosis not present

## 2020-10-22 DIAGNOSIS — F411 Generalized anxiety disorder: Secondary | ICD-10-CM | POA: Diagnosis not present

## 2020-11-05 DIAGNOSIS — R4681 Obsessive-compulsive behavior: Secondary | ICD-10-CM | POA: Diagnosis not present

## 2020-11-05 DIAGNOSIS — F411 Generalized anxiety disorder: Secondary | ICD-10-CM | POA: Diagnosis not present

## 2020-11-09 ENCOUNTER — Other Ambulatory Visit: Payer: Self-pay | Admitting: Psychiatry

## 2020-11-09 DIAGNOSIS — F422 Mixed obsessional thoughts and acts: Secondary | ICD-10-CM

## 2020-11-10 ENCOUNTER — Other Ambulatory Visit: Payer: Self-pay | Admitting: Psychiatry

## 2020-11-10 ENCOUNTER — Ambulatory Visit (INDEPENDENT_AMBULATORY_CARE_PROVIDER_SITE_OTHER): Payer: BC Managed Care – PPO | Admitting: Psychiatry

## 2020-11-10 ENCOUNTER — Other Ambulatory Visit: Payer: Self-pay

## 2020-11-10 ENCOUNTER — Encounter: Payer: Self-pay | Admitting: Psychiatry

## 2020-11-10 DIAGNOSIS — F902 Attention-deficit hyperactivity disorder, combined type: Secondary | ICD-10-CM | POA: Diagnosis not present

## 2020-11-10 DIAGNOSIS — F422 Mixed obsessional thoughts and acts: Secondary | ICD-10-CM | POA: Diagnosis not present

## 2020-11-10 DIAGNOSIS — F952 Tourette's disorder: Secondary | ICD-10-CM

## 2020-11-10 MED ORDER — FLUOXETINE HCL 20 MG/5ML PO SOLN: 60.0000 mg | Freq: Every day | ORAL | 1 refills | Status: DC

## 2020-11-10 NOTE — Progress Notes (Signed)
Kim [name] [id number] 12-07-2000 20 y.o.  Video Visit via My Chart  I connected with pt by My Chart and verified that I am speaking with the correct person using two identifiers.   I discussed the limitations, risks, security and privacy concerns of performing an evaluation and management service by My Chart  and the availability of in person appointments. I also discussed with the patient that there may be a patient responsible charge related to this service. The patient expressed understanding and agreed to proceed.  I discussed the assessment and treatment plan with the patient. The patient was provided an opportunity to ask questions and all were answered. The patient agreed with the plan and demonstrated an understanding of the instructions.   The patient was advised to call back or seek an in-person evaluation if the symptoms worsen or if the condition fails to improve as anticipated.  I provided 30 minutes of video time during this encounter.  The patient was located at home and the provider was located office. Call started 3:00 and ended 3:30  Subjective:   Patient ID:  Kim [name] is a 20 y.o. (DOB [birth year]) female.  Chief Complaint:  Chief Complaint  Patient presents with  . Follow-up  . Mixed obsessional thoughts and acts    HPI Kim [name] presents today for follow-up of OCD, ADD, depression  02/20/20 appt with the following noted: Seen with mother Kim [name] and father Kim [name].  Rob needing psych help but gave up his appt for daughter to be seen before going to school.  She's going to [place] to school. She has OCD, ADD, Tourette's and depression.  They wonder about other med changes. She needs liquids bc anxiety swallowing pills. Pt doesn't drive DT anxiety.  Would like help with OCD.  Hesitant about ADD meds but poor tolerance with anxiety. Contamination fears with handwashing.  Germs in general and fear of vomtiing so hard to take pills. Handwashes time  hard to say but a couple of minutes.  Irritating time.   Triggers include cleaning.   She will avoid hospitals and in general doctors offices. Mo concerned OCD takes a lot of time and attention. She recognizes OCD started in middle school. Parents in support of her going to school  Depressed but not severe.  Not a direct consequence of OCD.  Feels exhausted and not motivated. Sleep about 9 hours.  Energy OK.  Not much to concentrate on.  Hard to read. ADD lack of focus is a problem on reading and is a concern for school.   Had racing heart before stimulants and they made it worse. Can get this with anxiety and get dizzy.  Leaves for school 8/31. [employer] first year.  In [place].    03/07/20 appt with the following noted: Abilify 5 definitely helped with depression. Dep 3/10.   More active.  Better productivity.  Better mood. Some difference with OCD.  Not as big improvement as with depression but better.  Not sure about time spent with OCD.  Anxiety generally 5/10/  Worst times 8/10.   Biggest concern about OCD and school is the potential time involved.  Went time to do school work sometimes hard to resist OCD.  Not very concerned about OCD affecting her socially.   SE: Some hunger at times.  Restlessness fine now.   Plan: Continue Lexapro at current dose Increase Abilify to 7-8 mg now and if improved then no higher. If no improvement then increase to 10 mg  daily.   06/25/2020 appointment with the following noted: Mostly good but not much improvement with OCD.  Depression changing like usual.  Weather affects her and other factors.  History of seasonal depression.  More tired and sleep more.  Annoying but not a huge deal.   Increase in Abilify without sig change.   Finishes exams Thursday and break for a month. No SE.   Able to enjoy things. Handwashing 5 min each time she washes them.  Hard to know how much total time. Tremor before Lexapro.    Plan: Continue Lexapro but increase  to 35 mg daily for 2 weeks, then 40 mg daily. High dose medically necessary.  Needs liquid and couldn't tolerate the sertraline. After exams drop Abilify back to 5 mg bc not better with increase to 10 mg.  08/12/2020 telephone call with the following noted: Kim [name] was taken off of Abilify and had her Lexapro increased. Since doing this she is feeling worse, more depressed and lack of energy. She would like to know what she needs to do to help with this MD response: Her last appointment was 06/25/2020.  Prior to that appointment we had increased Abilify from 5 mg to 10 mg without additional improvement.  Therefore the instructions were to reduce the Abilify back to 5 mg daily.  She was not instructed to discontinue it. Because the increase in Abilify had not been helpful from 5 to 10 mg, in addition to reducing the Abilify back to 5 mg she was to increase the Lexapro from 30 mg to 40 mg. It is unlikely that increasing Lexapro would make her more depressed.  I think the depression is worse because she stopped the Abilify apparently.  The increase in Lexapro could possibly make her more fatigued but worsening depression could be causing the fatigue.  The benefit from restarting Abilify 5 mg daily should be quick, that is benefit within a week. Therefore I suggest she continue the current dose of Lexapro and add 5 mg of Abilify back.  The depression should improve within a week or so.  If she is not better by February 10 then call us back  11/10/2020 appt noted: Stayed 1 semester at [employer] not a good fit and left.  Goiing to [employer] in the fall. Felt worse off the Abilify and restarted at 5 mg 08/12/20 and felt better energy. Doing [name] good, [name] busy.  Couple of classes at [employer] just to get some credit.s OCD is about the same without much change with increase in Lexapro.  No SE noted. Anxiety is fairly high usually associated with obsessive thoughts. Not too much  depression. Sleep regular. NO SE.   Not regular Zofran but occ and not sure what causes the nausea.  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  GF OCD.  Past Psychiatric Medication Trials: History of Tourette's, ADD, OCD History of ADD brief trials Adderall XR 15, guanfacine ER 1 tired, methylphenidate, Strattera No modafinil. Lexapro 40, sertraline liquid brief hard to swallow,   Abilify 10 no better than 5 wheich helped. mirtazapine, Wellbutrin  Review of Systems:  Review of Systems  Cardiovascular: Negative for palpitations.  Gastrointestinal: Positive for nausea.  Neurological: Positive for tremors. Negative for dizziness and weakness.    Medications: I have reviewed the patient's current medications.  Current Outpatient Medications  Medication Sig Dispense Refill  . Aripiprazole 1 MG/ML mL TAKE 10 MLS (10 MG TOTAL) BY MOUTH DAILY. [place]  mL 1  . escitalopram (LEXAPRO) 5 MG/5ML solution TAKE 30ML BY MOUTH EVERY DAY AFTER BREAKFAST 2700 mL 0  . ondansetron (ZOFRAN-ODT) 4 MG disintegrating tablet DISSOLVE 1 TABLET BY MOUTH ON TONGUE EVERY DAY    . doxycycline (VIBRA-TABS) 100 MG tablet Take 1 tablet (100 mg total) by mouth 2 (two) times daily. (Patient not taking: Reported on 11/10/2020) 20 tablet 0  . FLUoxetine (PROZAC) 20 MG/5ML solution Take 15 mLs (60 mg total) by mouth daily. 450 mL 1   No current facility-administered medications for this visit.    Medication Side Effects: None  Allergies: No Known Allergies  Past Medical History:  Diagnosis Date  . Constipation   . GERD (gastroesophageal reflux disease)   . Granuloma of skin    on forehead    Family History  Problem Relation Age of Onset  . GER disease Father   . Ulcerative colitis Father   . ADD / ADHD Father   . Anxiety disorder Father   . Depression Father   . Bipolar disorder Father   . Migraines Paternal Grandmother   . Anxiety disorder Paternal Grandmother   .  Depression Paternal Grandmother   . Seizures Neg Hx   . Autism Neg Hx   . Schizophrenia Neg Hx     Social History   Socioeconomic History  . Marital status: Single    Spouse name: n/a  . Number of children: 0  . Years of education: Not on file  . Highest education level: Not on file  Occupational History  . Occupation: Ship broker  Tobacco Use  . Smoking status: Never Smoker  . Smokeless tobacco: Never Used  Vaping Use  . Vaping Use: Never used  Substance and Sexual Activity  . Alcohol use: No  . Drug use: No  . Sexual activity: Never  Other Topics Concern  . Not on file  Social History Narrative   Lives with mom, dad, brother in the same house. She is in the 11th grade at [employer]. Does well in school. She enjoys listening to music, writing and singing.    Social Determinants of Health   Financial Resource Strain: Not on file  Food Insecurity: Not on file  Transportation Needs: Not on file  Physical Activity: Not on file  Stress: Not on file  Social Connections: Not on file  Intimate Partner Violence: Not on file    Past Medical History, Surgical history, Social history, and Family history were reviewed and updated as appropriate.   Please see review of systems for further details on the patient's review from today.   Objective:   Physical Exam:  There were no vitals taken for this visit.  Physical Exam Constitutional:      General: She is not in acute distress. Musculoskeletal:        General: No deformity.  Neurological:     Mental Status: She is alert and oriented to person, place, and time.     Cranial Nerves: No dysarthria.     Coordination: Coordination normal.  Psychiatric:        Attention and Perception: Attention and perception normal. She does not perceive auditory or visual hallucinations.        Mood and Affect: Mood is anxious and depressed. Affect is not labile, blunt, angry or inappropriate.        Speech: Speech normal.        Behavior:  Behavior normal. Behavior is cooperative.        Thought Content:  Thought content normal. Thought content is not paranoid or delusional. Thought content does not include homicidal or suicidal ideation. Thought content does not include homicidal plan.        Cognition and Memory: Cognition and memory normal.        Judgment: Judgment normal.     Comments: Insight intact OCD ongoing. She tends to be quiet.  She does provide adequate details. Mild depression anxiety more severe     Lab Review:  No results found for: NA, K, CL, CO2, GLUCOSE, BUN, CREATININE, CALCIUM, PROT, ALBUMIN, AST, ALT, ALKPHOS, BILITOT, GFRNONAA, GFRAA  No results found for: WBC, RBC, HGB, HCT, PLT, MCV, MCH, MCHC, RDW, LYMPHSABS, MONOABS, EOSABS, BASOSABS  No results found for: POCLITH, LITHIUM   No results found for: PHENYTOIN, PHENOBARB, VALPROATE, CBMZ   .res Assessment: Plan:    Kim [name] was seen today for follow-up and mixed obsessional thoughts and acts.  Diagnoses and all orders for this visit:  Mixed obsessional thoughts and acts -     FLUoxetine (PROZAC) 20 MG/5ML solution; Take 15 mLs (60 mg total) by mouth daily.  Attention deficit hyperactivity disorder (ADHD), combined type, moderate  Tourette's disorder     Greater than 50% of 30 min face to face time with patient was spent on counseling and coordination of care. We discussed Review Dr. Milana Huntsman notes and suggestions for options.  Review [facility] testing dated September 30, 2017.  SLC 6 A4 was long long.  HDR2A was normal genotype. Pharmacokinetic genes included normal 2D6, 3 A4, 2 C9, 2C19, 1A2, and intermediate 2B 6  Disc options switch needs alternative SSRI .  Needs liquid and couldn't tolerate the sertraline. Therefore fluoxetine vs paroxetine disc in detail. Discussed that this is a high dose and discussed serotonergic side effects and serotonin syndrome. Reduce Lexapro to 20 ml and add 5 ml fluoxetine for 1 week, Then reduce  Lexapro to 10 ml and increase fluoxetine to 10 ml for 1 week, Then stop Lexapro and increase fluoxetine to 15 ml daily  Coffee worsens tremor discussed  Asks for Zofran prn.  Okay she will let us know what pharmacy to send to.  Option lamotrigine for SAD and off label OCD.  Also discussed that Zofran can be used off label to potentiate OCD treatment but that is not what were trying to do at this time.  Just asked to have Zofran to use occasionally for nausea because she does not have a PCP in her area.  She will be returning to [place] this month for Christmas break  Continue Abilify 5 mg Discussed potential metabolic side effects associated with atypical antipsychotics, as well as potential risk for movement side effects. Advised pt to contact office if movement side effects occur.   Disc SE and potential  DDI fluoxetine and Abilify increasing level of Abilify  Increase vitamin D 4000-5000 units daily until April 1.  Disc counseling  FU 8 weeks.  Lynder Parents, MD, DFAPA  Please see After Visit Summary for patient specific instructions.  No future appointments.  No orders of the defined types were placed in this encounter.   -------------------------------

## 2020-11-10 NOTE — Patient Instructions (Addendum)
Reduce Lexapro to 20 ml and add 5 ml fluoxetine for 1 week, Then reduce Lexapro to 10 ml and increase fluoxetine to 10 ml for 1 week, Then stop Lexapro and increase fluoxetine to 15 ml daily

## 2020-11-12 DIAGNOSIS — R4681 Obsessive-compulsive behavior: Secondary | ICD-10-CM | POA: Diagnosis not present

## 2020-11-12 DIAGNOSIS — F411 Generalized anxiety disorder: Secondary | ICD-10-CM | POA: Diagnosis not present

## 2020-11-19 DIAGNOSIS — R4681 Obsessive-compulsive behavior: Secondary | ICD-10-CM | POA: Diagnosis not present

## 2020-11-19 DIAGNOSIS — F411 Generalized anxiety disorder: Secondary | ICD-10-CM | POA: Diagnosis not present

## 2020-11-26 DIAGNOSIS — R4681 Obsessive-compulsive behavior: Secondary | ICD-10-CM | POA: Diagnosis not present

## 2020-11-26 DIAGNOSIS — F411 Generalized anxiety disorder: Secondary | ICD-10-CM | POA: Diagnosis not present

## 2020-12-03 DIAGNOSIS — R4681 Obsessive-compulsive behavior: Secondary | ICD-10-CM | POA: Diagnosis not present

## 2020-12-03 DIAGNOSIS — F411 Generalized anxiety disorder: Secondary | ICD-10-CM | POA: Diagnosis not present

## 2020-12-04 ENCOUNTER — Other Ambulatory Visit: Payer: Self-pay | Admitting: Family Medicine

## 2020-12-04 DIAGNOSIS — D242 Benign neoplasm of left breast: Secondary | ICD-10-CM

## 2020-12-05 ENCOUNTER — Other Ambulatory Visit: Payer: Self-pay | Admitting: Psychiatry

## 2020-12-05 DIAGNOSIS — F422 Mixed obsessional thoughts and acts: Secondary | ICD-10-CM

## 2020-12-09 ENCOUNTER — Other Ambulatory Visit: Payer: Self-pay | Admitting: Psychiatry

## 2020-12-09 DIAGNOSIS — F422 Mixed obsessional thoughts and acts: Secondary | ICD-10-CM

## 2020-12-16 ENCOUNTER — Other Ambulatory Visit: Payer: Self-pay | Admitting: Psychiatry

## 2020-12-16 DIAGNOSIS — F422 Mixed obsessional thoughts and acts: Secondary | ICD-10-CM

## 2020-12-25 ENCOUNTER — Ambulatory Visit
Admission: RE | Admit: 2020-12-25 | Discharge: 2020-12-25 | Disposition: A | Discharge status: Home / Self Care | Payer: BC Managed Care – PPO | Source: Ambulatory Visit | Attending: Family Medicine | Admitting: Family Medicine

## 2020-12-25 ENCOUNTER — Other Ambulatory Visit: Payer: Self-pay

## 2020-12-25 DIAGNOSIS — D242 Benign neoplasm of left breast: Secondary | ICD-10-CM

## 2020-12-31 DIAGNOSIS — F411 Generalized anxiety disorder: Secondary | ICD-10-CM | POA: Diagnosis not present

## 2020-12-31 DIAGNOSIS — R4681 Obsessive-compulsive behavior: Secondary | ICD-10-CM | POA: Diagnosis not present

## 2021-01-11 IMAGING — US ULTRASOUND LEFT BREAST LIMITED
1 series · 5 of 5 positions shown · non-contrast
Comparison: 08/25/2018

CLINICAL DATA: First six-month follow-up for probably benign LEFT
breast mass.

EXAM:
ULTRASOUND OF THE LEFT BREAST

[Series 1: ultrasound left breast limited · 0.06mm/px · 5 of 5 slices shown]
[im 1/5]
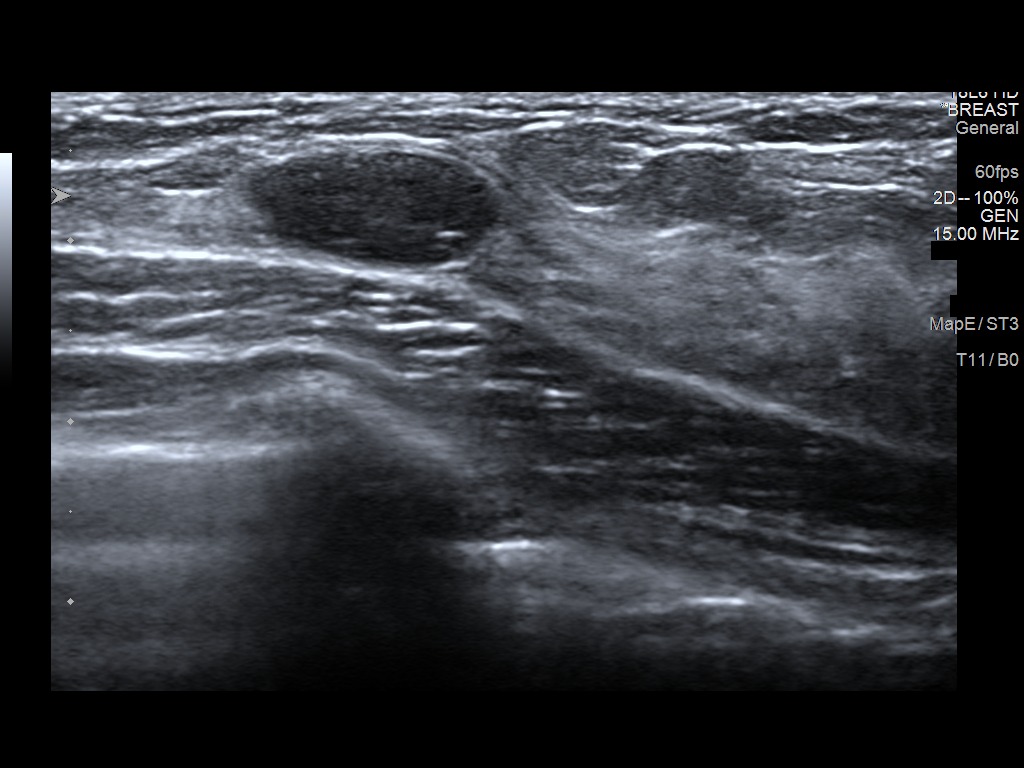
[im 2/5]
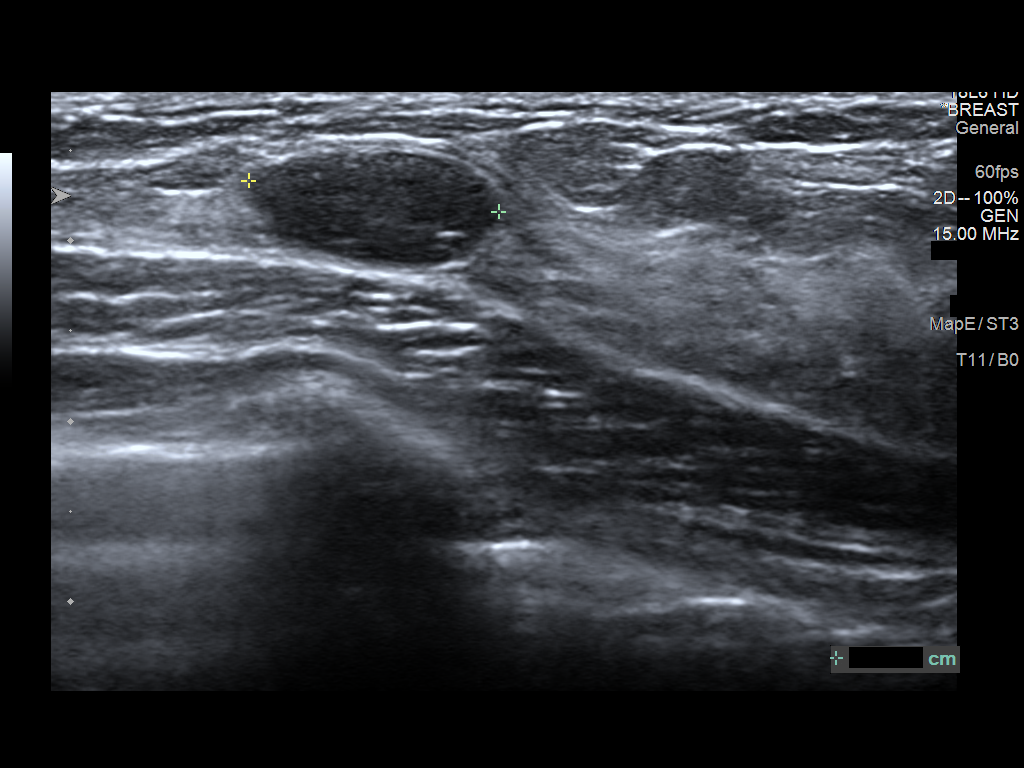
[im 3/5]
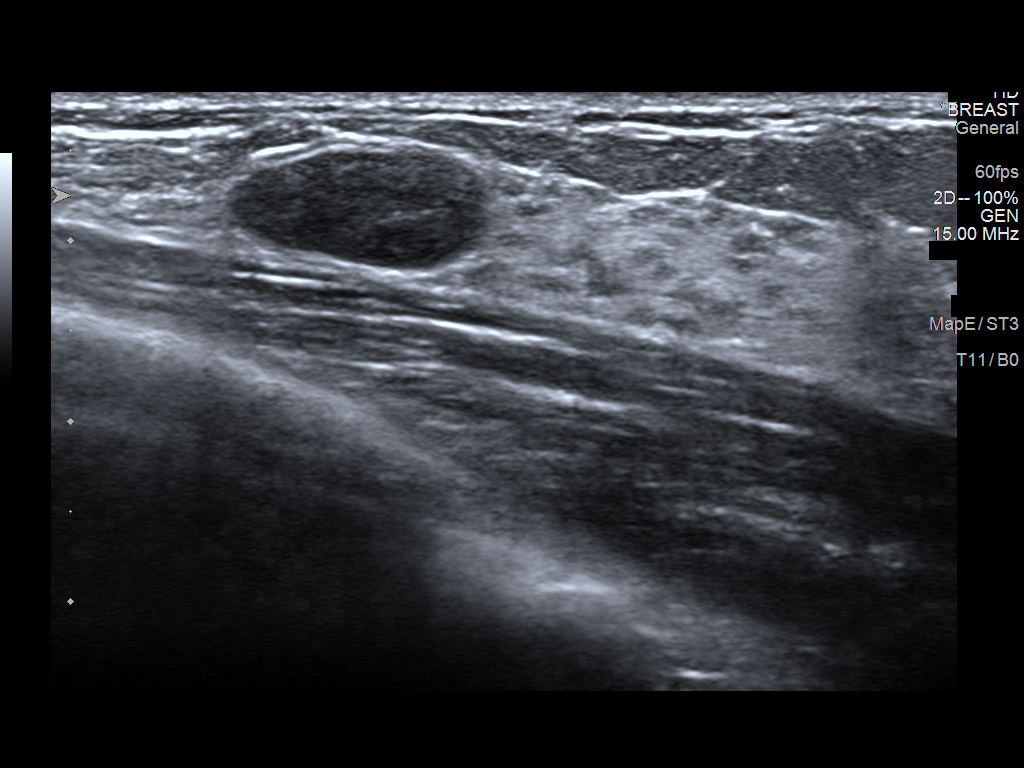
[im 4/5]
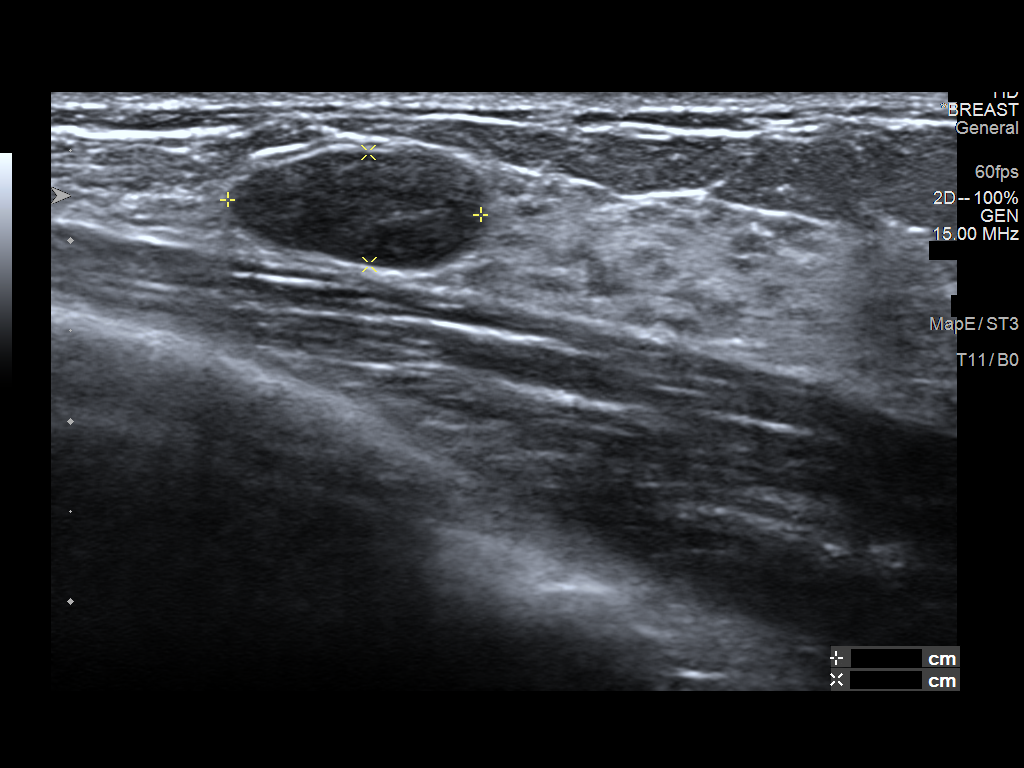
[im 5/5]
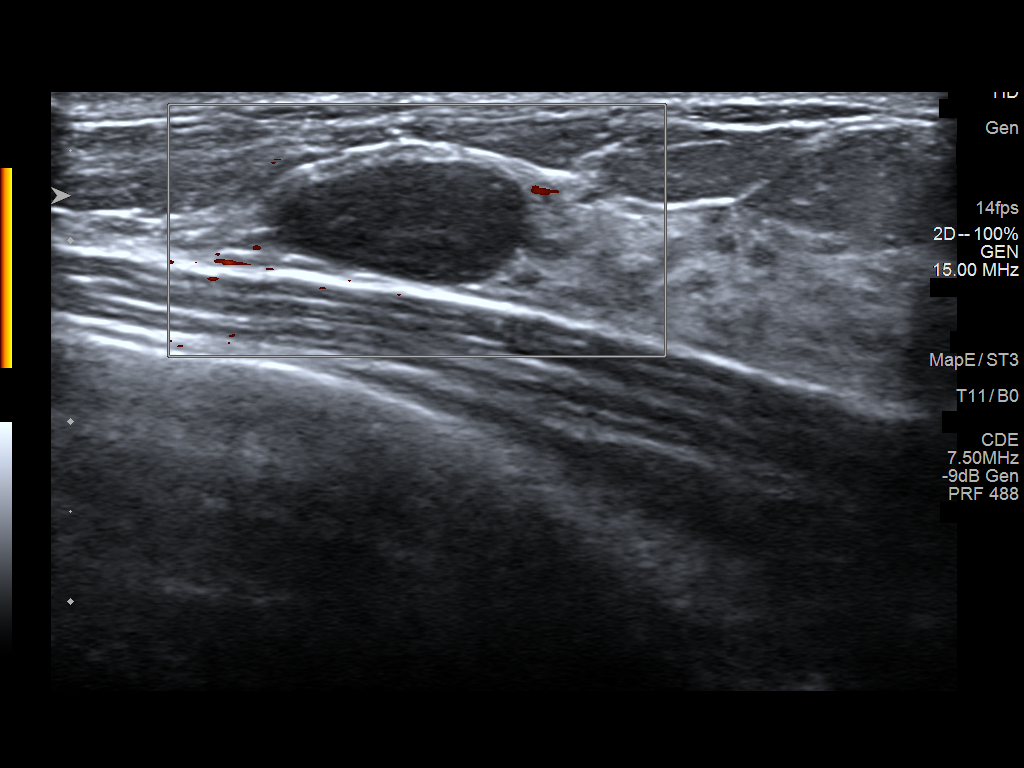

[5 of 5 positions shown; findings below may reference images not displayed]

FINDINGS: Targeted ultrasound is performed, showing a circumscribed oval
hypoechoic parallel mass in the 11 o'clock location of the LEFT
breast 5 centimeters from the nipple which measures 1.4 x 1.4 x
centimeters. Mass appears stable compared to previous study.
IMPRESSION: Stable appearance of probably benign LEFT breast fibroadenoma.

RECOMMENDATION:
Recommend LEFT breast ultrasound in 6 months.

I have discussed the findings and recommendations with the patient.
Results were also provided in writing at the conclusion of the
visit. If applicable, a reminder letter will be sent to the patient
regarding the next appointment.

BI-RADS CATEGORY  3: Probably benign.

## 2021-02-16 ENCOUNTER — Ambulatory Visit (INDEPENDENT_AMBULATORY_CARE_PROVIDER_SITE_OTHER): Payer: BC Managed Care – PPO | Admitting: Psychiatry

## 2021-02-16 ENCOUNTER — Other Ambulatory Visit: Payer: Self-pay

## 2021-02-16 ENCOUNTER — Encounter: Payer: Self-pay | Admitting: Psychiatry

## 2021-02-16 DIAGNOSIS — F422 Mixed obsessional thoughts and acts: Secondary | ICD-10-CM | POA: Diagnosis not present

## 2021-02-16 DIAGNOSIS — G901 Familial dysautonomia [Riley-Day]: Secondary | ICD-10-CM

## 2021-02-16 DIAGNOSIS — F902 Attention-deficit hyperactivity disorder, combined type: Secondary | ICD-10-CM | POA: Diagnosis not present

## 2021-02-16 DIAGNOSIS — R198 Other specified symptoms and signs involving the digestive system and abdomen: Secondary | ICD-10-CM

## 2021-02-16 DIAGNOSIS — F952 Tourette's disorder: Secondary | ICD-10-CM

## 2021-02-16 MED ORDER — METHYLPHENIDATE HCL ER (OSM) 36 MG PO TBCR: 36.0000 mg | EXTENDED_RELEASE_TABLET | Freq: Every day | ORAL | 0 refills | Status: DC

## 2021-02-16 MED ORDER — FLUOXETINE HCL 20 MG/5ML PO SOLN: 80.0000 mg | Freq: Every day | ORAL | 1 refills | Status: DC

## 2021-02-16 NOTE — Progress Notes (Signed)
[name] [id number] [birth year] 20 y.o.   Subjective:   Patient ID:  Kim [name] is a 20 y.o. (DOB Apr 25, 2001) female.  Chief Complaint:  Chief Complaint  Patient presents with   Follow-up   Anxiety    HPI [name] presents today for follow-up of OCD, ADD, depression  02/20/20 appt with the following noted: Seen with mother [name] and father [name].  Rob needing psych help but gave up his appt for daughter to be seen before going to school.  She's going to [place] to school. She has OCD, ADD, Tourette's and depression.  They wonder about other med changes. She needs liquids bc anxiety swallowing pills. Pt doesn't drive DT anxiety.  Would like help with OCD.  Hesitant about ADD meds but poor tolerance with anxiety. Contamination fears with handwashing.  Germs in general and fear of vomtiing so hard to take pills. Handwashes time hard to say but a couple of minutes.  Irritating time.   Triggers include cleaning.   She will avoid hospitals and in general doctors offices. Mo concerned OCD takes a lot of time and attention. She recognizes OCD started in middle school. Parents in support of her going to school  Depressed but not severe.  Not a direct consequence of OCD.  Feels exhausted and not motivated. Sleep about 9 hours.  Energy OK.  Not much to concentrate on.  Hard to read. ADD lack of focus is a problem on reading and is a concern for school.   Had racing heart before stimulants and they made it worse. Can get this with anxiety and get dizzy.  Leaves for school 8/31. [employer] first year.  In [place].    03/07/20 appt with the following noted: Abilify 5 definitely helped with depression. Dep 3/10.   More active.  Better productivity.  Better mood. Some difference with OCD.  Not as big improvement as with depression but better.  Not sure about time spent with OCD.  Anxiety generally 5/10/  Worst times 8/10.   Biggest concern about OCD and school is  the potential time involved.  Went time to do school work sometimes hard to resist OCD.  Not very concerned about OCD affecting her socially.   SE: Some hunger at times.  Restlessness fine now.   Plan: Continue Lexapro at current dose Increase Abilify to 7-8 mg now and if improved then no higher. If no improvement then increase to 10 mg daily.   06/25/2020 appointment with the following noted: Mostly good but not much improvement with OCD.  Depression changing like usual.  Weather affects her and other factors.  History of seasonal depression.  More tired and sleep more.  Annoying but not a huge deal.   Increase in Abilify without sig change.   Finishes exams Thursday and break for a month. No SE.   Able to enjoy things. Handwashing 5 min each time she washes them.  Hard to know how much total time. Tremor before Lexapro.    Plan: Continue Lexapro but increase to 35 mg daily for 2 weeks, then 40 mg daily. High dose medically necessary.  Needs liquid and couldn't tolerate the sertraline. After exams drop Abilify back to 5 mg bc not better with increase to 10 mg.  08/12/2020 telephone call with the following noted: Leighana was taken off of Abilify and had her Lexapro increased. Since doing this she is feeling worse, more depressed and lack of energy. She would like to know  what she needs to do to help with this MD response: Her last appointment was 06/25/2020.  Prior to that appointment we had increased Abilify from 5 mg to 10 mg without additional improvement.  Therefore the instructions were to reduce the Abilify back to 5 mg daily.  She was not instructed to discontinue it. Because the increase in Abilify had not been helpful from 5 to 10 mg, in addition to reducing the Abilify back to 5 mg she was to increase the Lexapro from 30 mg to 40 mg. It is unlikely that increasing Lexapro would make her more depressed.  I think the depression is worse because she stopped the Abilify apparently.  The  increase in Lexapro could possibly make her more fatigued but worsening depression could be causing the fatigue.  The benefit from restarting Abilify 5 mg daily should be quick, that is benefit within a week. Therefore I suggest she continue the current dose of Lexapro and add 5 mg of Abilify back.  The depression should improve within a week or so.  If she is not better by February 10 then call us back  11/10/2020 appt noted: Stayed 1 semester at [employer] not a good fit and left.  Goiing to [employer] in the fall. Felt worse off the Abilify and restarted at 5 mg 08/12/20 and felt better energy. Doing pretty good, pretty busy.  Couple of classes at [employer] just to get some credit.s OCD is about the same without much change with increase in Lexapro.  No SE noted. Anxiety is fairly high usually associated with obsessive thoughts. Not too much depression. Sleep regular. NO SE.   Not regular Zofran but occ and not sure what causes the nausea. Plan: Reduce Lexapro to 20 ml and add 5 ml fluoxetine for 1 week, Then reduce Lexapro to 10 ml and increase fluoxetine to 10 ml for 1 week, Then stop Lexapro and increase fluoxetine to 15 ml daily  02/16/2021 appointment with the following noted:  Seen with DAD Switched to fluoxetine 15 ml and energy and motivation and mood is better.  Anxiety is a little better but not b much.  OCD is about the same. No problems with the switch in meds.   Hard to tell how much time OCD takes bc she can mutifunciton while does compulsions. Avoids contaminants and handwashes but can work at [employer]. No SE.   Not really depressed.   Takes Zofran intermittently a couple times per week can be triggered with caffeine. Father says she's seemed to be doing well and excited about school. Going to [employer] to [employer]. Less tremor.  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  Past Psychiatric  Medication Trials: History of Tourette's, ADD, OCD History of ADD brief trials Adderall XR 15, guanfacine ER 1 tired, methylphenidate, Strattera No modafinil. Lexapro 40, sertraline liquid brief hard to swallow,   Fluoxetine 60 better mood Abilify 10 no better than 5 wheich helped. mirtazapine, Wellbutrin  Review of Systems:  Review of Systems  Cardiovascular:  Negative for palpitations.  Gastrointestinal:  Positive for nausea.  Neurological:  Negative for dizziness, tremors and weakness.   Medications: I have reviewed the patient's current medications.  Current Outpatient Medications  Medication Sig Dispense Refill   Aripiprazole 1 MG/ML mL TAKE 10 ML BY MOUTH DAILY (Patient taking differently: Take 5 mg by mouth daily.) 300 mL 1   doxycycline (VIBRA-TABS) 100 MG tablet  Take 1 tablet (100 mg total) by mouth 2 (two) times daily. 20 tablet 0   methylphenidate (CONCERTA) 36 MG PO CR tablet Take 1 tablet (36 mg total) by mouth daily. 30 tablet 0   ondansetron (ZOFRAN-ODT) 4 MG disintegrating tablet DISSOLVE 1 TABLET BY MOUTH ON TONGUE EVERY DAY     FLUoxetine (PROZAC) 20 MG/5ML solution Take 20 mLs (80 mg total) by mouth daily. 1800 mL 1   No current facility-administered medications for this visit.    Medication Side Effects: None  Allergies: No Known Allergies  Past Medical History:  Diagnosis Date   Constipation    GERD (gastroesophageal reflux disease)    Granuloma of skin    on forehead    Family History  Problem Relation Age of Onset   GER disease Father    Ulcerative colitis Father    ADD / ADHD Father    Anxiety disorder Father    Depression Father    Bipolar disorder Father    Migraines Paternal Grandmother    Anxiety disorder Paternal Grandmother    Depression Paternal Grandmother    Seizures Neg Hx    Autism Neg Hx    Schizophrenia Neg Hx     Social History   Socioeconomic History   Marital status: Single    Spouse name: n/a   Number of children: 0    Years of education: Not on file   Highest education level: Not on file  Occupational History   Occupation: student  Tobacco Use   Smoking status: Never   Smokeless tobacco: Never  Vaping Use   Vaping Use: Never used  Substance and Sexual Activity   Alcohol use: No   Drug use: No   Sexual activity: Never  Other Topics Concern   Not on file  Social History Narrative   Lives with mom, dad, brother in the same house. She is in the 11th grade at [employer]. Does well in school. She enjoys listening to music, writing and singing.    Social Determinants of Health   Financial Resource Strain: Not on file  Food Insecurity: Not on file  Transportation Needs: Not on file  Physical Activity: Not on file  Stress: Not on file  Social Connections: Not on file  Intimate Partner Violence: Not on file    Past Medical History, Surgical history, Social history, and Family history were reviewed and updated as appropriate.   Please see review of systems for further details on the patient's review from today.   Objective:   Physical Exam:  There were no vitals taken for this visit.  Physical Exam Constitutional:      General: She is not in acute distress. Musculoskeletal:        General: No deformity.  Neurological:     Mental Status: She is alert and oriented to person, place, and time.     Coordination: Coordination normal.  Psychiatric:        Attention and Perception: Attention and perception normal. She does not perceive auditory or visual hallucinations.        Mood and Affect: Mood is anxious. Mood is not depressed. Affect is not labile, blunt, angry or inappropriate.        Speech: Speech normal.        Behavior: Behavior normal. Behavior is cooperative.        Thought Content: Thought content normal. Thought content is not paranoid or delusional. Thought content does not include homicidal or suicidal ideation. Thought content  does not include homicidal plan.        Cognition  and Memory: Cognition and memory normal.        Judgment: Judgment normal.     Comments: Insight intact OCD ongoing. She tends to be quiet.  She does provide adequate details. Mild depression anxiety more severe    Lab Review:  No results found for: NA, K, CL, CO2, GLUCOSE, BUN, CREATININE, CALCIUM, PROT, ALBUMIN, AST, ALT, ALKPHOS, BILITOT, GFRNONAA, GFRAA  No results found for: WBC, RBC, HGB, HCT, PLT, MCV, MCH, MCHC, RDW, LYMPHSABS, MONOABS, EOSABS, BASOSABS  No results found for: POCLITH, LITHIUM   No results found for: PHENYTOIN, PHENOBARB, VALPROATE, CBMZ   .res Assessment: Plan:    Nathalya was seen today for follow-up and anxiety.  Diagnoses and all orders for this visit:  Mixed obsessional thoughts and acts -     FLUoxetine (PROZAC) 20 MG/5ML solution; Take 20 mLs (80 mg total) by mouth daily.  Attention deficit hyperactivity disorder (ADHD), combined type, moderate -     methylphenidate (CONCERTA) 36 MG PO CR tablet; Take 1 tablet (36 mg total) by mouth daily.  Tourette's disorder  Dysautonomia ([place])  Difficulty swallowing pills    Greater than 50% of 30 min face to face time with patient was spent on counseling and coordination of care. We discussed Review Dr. Milana Huntsman notes and suggestions for options.  Review GeneSight testing dated September 30, 2017.  SLC 6 A4 was long long.  HDR2A was normal genotype. Pharmacokinetic genes included normal 2D6, 3 A4, 2 C9, 2C19, 1A2, and intermediate 2B 6  Disc options switch needs alternative SSRI .  Needs liquid and couldn't tolerate the sertraline. Therefore fluoxetine vs paroxetine disc in detail. Discussed that this is a high dose and discussed serotonergic side effects and serotonin syndrome. Option increase fluoxetine to 80 mg daily for OCD.  She'd like to try it. Disc se and option reduce if needed.  Coffee worsens tremor discussed and she's aware.  Asks for Zofran prn.  Okay she will let us know what pharmacy  to send to.  Wants to restart ADHD meds for school.  Hard to swallow pills.  Concerta trial but if can't swallow then Laurence Ferrari Asks for accomodation for extended deadlines and copy of professor's notes.  Disc this.  Option lamotrigine for SAD and off label OCD.  Also discussed that Zofran can be used off label to potentiate OCD treatment but that is not what were trying to do at this time.  Just asked to have Zofran to use occasionally for nausea because she does not have a PCP in her area.  She will be returning to [place] this month for Christmas break  Continue Abilify 5 mg Discussed potential metabolic side effects associated with atypical antipsychotics, as well as potential risk for movement side effects. Advised pt to contact office if movement side effects occur.   Disc SE and potential  DDI fluoxetine and Abilify increasing level of Abilify  Increase vitamin D 4000-5000 units daily in winter.  Disc counseling  FU XMAS break  Lynder Parents, MD, DFAPA  Please see After Visit Summary for patient specific instructions.  No future appointments.  No orders of the defined types were placed in this encounter.   -------------------------------

## 2021-02-25 ENCOUNTER — Telehealth: Payer: Self-pay | Admitting: Psychiatry

## 2021-02-25 ENCOUNTER — Other Ambulatory Visit: Payer: Self-pay | Admitting: Psychiatry

## 2021-02-25 DIAGNOSIS — R4681 Obsessive-compulsive behavior: Secondary | ICD-10-CM | POA: Diagnosis not present

## 2021-02-25 DIAGNOSIS — F902 Attention-deficit hyperactivity disorder, combined type: Secondary | ICD-10-CM

## 2021-02-25 DIAGNOSIS — F411 Generalized anxiety disorder: Secondary | ICD-10-CM | POA: Diagnosis not present

## 2021-02-25 MED ORDER — QUILLIVANT XR 25 MG/5ML PO SRER: ORAL | 0 refills | Status: DC

## 2021-02-25 NOTE — Telephone Encounter (Signed)
Because this is a highly controlled substance ask her to bring in the unused tablets to our office to be dropped off to me.  I sent in a prescription for the liquid [facility]ate to [facility] [address].  There may be a PA required; actually I am pretty sure there will be so it may be a day or 2 before she can get it.

## 2021-02-25 NOTE — Telephone Encounter (Signed)
Pt picked up rx on 8/8 but has not been taking it.She said her OCD won't allow her to swallow pills

## 2021-02-25 NOTE — Telephone Encounter (Signed)
Pt called and said that she would like to do the liquid form of the methlphendate 36 mg . She can't do the pills. Please call pt at [phone]

## 2021-02-26 ENCOUNTER — Other Ambulatory Visit: Payer: Self-pay

## 2021-02-26 ENCOUNTER — Ambulatory Visit (INDEPENDENT_AMBULATORY_CARE_PROVIDER_SITE_OTHER): Payer: BC Managed Care – PPO | Admitting: Podiatry

## 2021-02-26 DIAGNOSIS — L600 Ingrowing nail: Secondary | ICD-10-CM

## 2021-02-26 MED ORDER — AMOXICILLIN 250 MG PO CHEW: 250.0000 mg | CHEWABLE_TABLET | Freq: Three times a day (TID) | ORAL | 1 refills | Status: DC

## 2021-02-26 NOTE — Patient Instructions (Signed)

## 2021-02-26 NOTE — Progress Notes (Signed)
Subjective:   Patient ID: Kim [name], female   DOB: 20 y.o.   MRN: [id number]   HPI Patient presents with father with painful chronic ingrown toenail of the left big toe stating its been very sore and hard to wear shoe gear with.  There is been some drainage on the inside and she soaks it and has not noted drainage recently   ROS      Objective:  Physical Exam  Neurovascular status intact muscle strength was found to be adequate with patient found to have incurvation of the lateral border left hallux with proud flesh formation pain no active redness drainage noted     Assessment:  Severe chronic ingrown toenail deformity left hallux lateral border     Plan:  H&P reviewed condition with her and father recommended correction of deformity explained procedure risk and they signed consent form.  I infiltrated the left hallux 60 mg like Marcaine mixture sterile prep done and using sterile instrumentation remove the border removed all prep/created a channel and applied chemical phenol 3 applications 30 seconds followed by alcohol lavage sterile dressing.  Placed on chewable amoxicillin and instructed on its usage 3 a day for 10 days and soaks and patient will be seen back as needed and leave dressing on 24 hours take off earlier if any throbbing were to occur

## 2021-02-26 NOTE — Telephone Encounter (Signed)
Pt informed and im sure you probably have a PA for her

## 2021-03-09 ENCOUNTER — Other Ambulatory Visit: Payer: Self-pay | Admitting: Psychiatry

## 2021-03-09 DIAGNOSIS — F422 Mixed obsessional thoughts and acts: Secondary | ICD-10-CM

## 2021-03-17 ENCOUNTER — Other Ambulatory Visit: Payer: Self-pay

## 2021-03-17 ENCOUNTER — Telehealth: Payer: Self-pay | Admitting: Psychiatry

## 2021-03-17 DIAGNOSIS — F422 Mixed obsessional thoughts and acts: Secondary | ICD-10-CM

## 2021-03-17 MED ORDER — FLUOXETINE HCL 20 MG/5ML PO SOLN: ORAL | 0 refills | Status: DC

## 2021-03-17 NOTE — Telephone Encounter (Signed)
Rx sent 

## 2021-03-17 NOTE — Telephone Encounter (Signed)
Dad called to request that Kim [name]'s prescriptions be transfer to a pharmacy where she is in school.  She did pick up her Gurney Maxin before she left for college, but did not pick up the Prozac.  So she needs to prozac and abilify refilled now.  And the next Gurney Maxin needs to go to the new pharmacy.  [facility], [place], [place]

## 2021-03-18 ENCOUNTER — Other Ambulatory Visit: Payer: Self-pay

## 2021-03-18 ENCOUNTER — Other Ambulatory Visit: Payer: Self-pay | Admitting: Psychiatry

## 2021-03-18 DIAGNOSIS — F422 Mixed obsessional thoughts and acts: Secondary | ICD-10-CM

## 2021-03-18 MED ORDER — ARIPIPRAZOLE 1 MG/ML PO SOLN: 10.0000 mg | Freq: Every day | ORAL | 1 refills | Status: DC

## 2021-03-18 NOTE — Telephone Encounter (Signed)
Pt dad called and the prescription for the [place] was not sent.  Pls send it to the [facility].

## 2021-03-18 NOTE — Telephone Encounter (Signed)
The Abilify gives me a error when I try to send

## 2021-03-18 NOTE — Telephone Encounter (Signed)
RX sent

## 2021-04-08 DIAGNOSIS — F411 Generalized anxiety disorder: Secondary | ICD-10-CM | POA: Diagnosis not present

## 2021-04-08 DIAGNOSIS — R4681 Obsessive-compulsive behavior: Secondary | ICD-10-CM | POA: Diagnosis not present

## 2021-04-22 DIAGNOSIS — R4681 Obsessive-compulsive behavior: Secondary | ICD-10-CM | POA: Diagnosis not present

## 2021-04-22 DIAGNOSIS — F411 Generalized anxiety disorder: Secondary | ICD-10-CM | POA: Diagnosis not present

## 2021-04-29 DIAGNOSIS — R4681 Obsessive-compulsive behavior: Secondary | ICD-10-CM | POA: Diagnosis not present

## 2021-04-29 DIAGNOSIS — F411 Generalized anxiety disorder: Secondary | ICD-10-CM | POA: Diagnosis not present

## 2021-05-05 ENCOUNTER — Telehealth: Payer: Self-pay | Admitting: Psychiatry

## 2021-05-05 NOTE — Telephone Encounter (Signed)
Pt LVM stating she feels that the Gurney Maxin is making her depression worse.  She is asking if she should reduce the dose or wait it out?   Next appt 12/21

## 2021-05-05 NOTE — Telephone Encounter (Signed)
Unable to LVM will try again  

## 2021-05-06 ENCOUNTER — Other Ambulatory Visit: Payer: Self-pay | Admitting: Psychiatry

## 2021-05-06 DIAGNOSIS — R4681 Obsessive-compulsive behavior: Secondary | ICD-10-CM | POA: Diagnosis not present

## 2021-05-06 DIAGNOSIS — F411 Generalized anxiety disorder: Secondary | ICD-10-CM | POA: Diagnosis not present

## 2021-05-06 NOTE — Telephone Encounter (Signed)
Drop the dose to 1.2 ml daily for 7-10 days and call back and tell us if the depression resolved and if the low dose is helping any with focus and concentration.

## 2021-05-06 NOTE — Telephone Encounter (Signed)
Pt stated she has been taking it as prescribed for about a week and her depression seems to be worse.She wants to know if she should decrease the dose

## 2021-05-06 NOTE — Telephone Encounter (Signed)
That is unusual but not impossible.  Drop the dose in half for a week or 10 days and if the depression resolves and she needs to increase that she can increase it back up but maybe go up more slowly like about 25 %/week back to the original dose.

## 2021-05-06 NOTE — Telephone Encounter (Signed)
Pt stated she is actually taking 2.5 ml.She left that out when we spoke earlier because she did state she was taking it as prescribed.How many ML should she take since she is already taking 2.61ml?

## 2021-05-07 NOTE — Telephone Encounter (Signed)
Pt informed

## 2021-05-20 DIAGNOSIS — F411 Generalized anxiety disorder: Secondary | ICD-10-CM | POA: Diagnosis not present

## 2021-05-20 DIAGNOSIS — R4681 Obsessive-compulsive behavior: Secondary | ICD-10-CM | POA: Diagnosis not present

## 2021-06-10 DIAGNOSIS — R4681 Obsessive-compulsive behavior: Secondary | ICD-10-CM | POA: Diagnosis not present

## 2021-06-10 DIAGNOSIS — F411 Generalized anxiety disorder: Secondary | ICD-10-CM | POA: Diagnosis not present

## 2021-06-22 ENCOUNTER — Other Ambulatory Visit: Payer: Self-pay | Admitting: Psychiatry

## 2021-06-22 DIAGNOSIS — F422 Mixed obsessional thoughts and acts: Secondary | ICD-10-CM

## 2021-06-22 NOTE — Telephone Encounter (Signed)
Please send correct dosing

## 2021-06-24 DIAGNOSIS — R4681 Obsessive-compulsive behavior: Secondary | ICD-10-CM | POA: Diagnosis not present

## 2021-06-24 DIAGNOSIS — F411 Generalized anxiety disorder: Secondary | ICD-10-CM | POA: Diagnosis not present

## 2021-06-25 DIAGNOSIS — H60312 Diffuse otitis externa, left ear: Secondary | ICD-10-CM | POA: Diagnosis not present

## 2021-06-25 DIAGNOSIS — H66009 Acute suppurative otitis media without spontaneous rupture of ear drum, unspecified ear: Secondary | ICD-10-CM | POA: Diagnosis not present

## 2021-07-01 ENCOUNTER — Encounter: Payer: Self-pay | Admitting: Psychiatry

## 2021-07-01 ENCOUNTER — Other Ambulatory Visit: Payer: Self-pay

## 2021-07-01 ENCOUNTER — Ambulatory Visit (INDEPENDENT_AMBULATORY_CARE_PROVIDER_SITE_OTHER): Payer: BC Managed Care – PPO | Admitting: Psychiatry

## 2021-07-01 VITALS — BP 109/63 | HR 86

## 2021-07-01 DIAGNOSIS — G901 Familial dysautonomia [Riley-Day]: Secondary | ICD-10-CM

## 2021-07-01 DIAGNOSIS — F422 Mixed obsessional thoughts and acts: Secondary | ICD-10-CM

## 2021-07-01 DIAGNOSIS — F902 Attention-deficit hyperactivity disorder, combined type: Secondary | ICD-10-CM

## 2021-07-01 DIAGNOSIS — F952 Tourette's disorder: Secondary | ICD-10-CM

## 2021-07-01 DIAGNOSIS — R198 Other specified symptoms and signs involving the digestive system and abdomen: Secondary | ICD-10-CM

## 2021-07-01 MED ORDER — LISDEXAMFETAMINE DIMESYLATE 30 MG PO CAPS: 30.0000 mg | ORAL_CAPSULE | Freq: Every day | ORAL | 0 refills | Status: DC

## 2021-07-01 MED ORDER — FLUOXETINE HCL 20 MG/5ML PO SOLN: ORAL | 0 refills | Status: DC

## 2021-07-01 NOTE — Progress Notes (Signed)
Kim [name] [id number] [birth year] 20 y.o.   Subjective:   Patient ID:  Kim [name] is a 20 y.o. (DOB Jun 14, 2001) female.  Chief Complaint:  Chief Complaint  Patient presents with   Follow-up    Mixed obsessional thoughts and acts   Anxiety    HPI Kim [name] presents today for follow-up of OCD, ADD, depression  02/20/20 appt with the following noted: Seen with mother Kim [name] and father Kim [name].  Kim [name] needing psych help but gave up his appt for daughter to be seen before going to school.  Kim [name]'s going to [place] to school. Kim [name] has OCD, ADD, Tourette's and depression.  They wonder about other med changes. Kim [name] needs liquids bc anxiety swallowing pills. Pt doesn't drive DT anxiety.  Would like help with OCD.  Hesitant about ADD meds but poor tolerance with anxiety. Contamination fears with handwashing.  Germs in general and fear of vomtiing so hard to take pills. Handwashes time hard to say but a couple of minutes.  Irritating time.   Triggers include cleaning.   Kim [name] will avoid hospitals and in general doctors offices. Mo concerned OCD takes a lot of time and attention. Kim [name] recognizes OCD started in middle school. Parents in support of Kim [name] going to school  Depressed but not severe.  Not a direct consequence of OCD.  Feels exhausted and not motivated. Sleep about 9 hours.  Energy OK.  Not much to concentrate on.  Hard to read. ADD lack of focus is a problem on reading and is a concern for school.   Had racing heart before stimulants and they made it worse. Can get this with anxiety and get dizzy.  Leaves for school 8/31. [employer] first year.  In [place].    03/07/20 appt with the following noted: Abilify 5 definitely helped with depression. Dep 3/10.   More active.  Better productivity.  Better mood. Some difference with OCD.  Not as big improvement as with depression but better.  Not sure about time spent with OCD.  Anxiety generally 5/10/  Worst times 8/10.    Biggest concern about OCD and school is the potential time involved.  Went time to do school work sometimes hard to resist OCD.  Not very concerned about OCD affecting Kim [name] socially.   SE: Some hunger at times.  Restlessness fine now.   Plan: Continue Lexapro at current dose Increase Abilify to 7-8 mg now and if improved then no higher. If no improvement then increase to 10 mg daily.   06/25/2020 appointment with the following noted: Mostly good but not much improvement with OCD.  Depression changing like usual.  Weather affects Kim [name] and other factors.  History of seasonal depression.  More tired and sleep more.  Annoying but not a huge deal.   Increase in Abilify without sig change.   Finishes exams Thursday and break for a month. No SE.   Able to enjoy things. Handwashing 5 min each time Kim [name] washes them.  Hard to know how much total time. Tremor before Lexapro.    Plan: Continue Lexapro but increase to 35 mg daily for 2 weeks, then 40 mg daily. High dose medically necessary.  Needs liquid and couldn't tolerate the sertraline. After exams drop Abilify back to 5 mg bc not better with increase to 10 mg.  08/12/2020 telephone call with the following noted: Kim [name] was taken off of Abilify and had Kim [name] Lexapro increased. Since doing this Kim [name] is feeling worse, more depressed and  lack of energy. Kim [name] would like to know what Kim [name] needs to do to help with this MD response: Kim [name] last appointment was 06/25/2020.  Prior to that appointment we had increased Abilify from 5 mg to 10 mg without additional improvement.  Therefore the instructions were to reduce the Abilify back to 5 mg daily.  Kim [name] was not instructed to discontinue it. Because the increase in Abilify had not been helpful from 5 to 10 mg, in addition to reducing the Abilify back to 5 mg Kim [name] was to increase the Lexapro from 30 mg to 40 mg. It is unlikely that increasing Lexapro would make Kim [name] more depressed.  I think the depression is worse because Kim [name]  stopped the Abilify apparently.  The increase in Lexapro could possibly make Kim [name] more fatigued but worsening depression could be causing the fatigue.  The benefit from restarting Abilify 5 mg daily should be quick, that is benefit within a week. Therefore I suggest Kim [name] continue the current dose of Lexapro and add 5 mg of Abilify back.  The depression should improve within a week or so.  If Kim [name] is not better by February 10 then call us back  11/10/2020 appt noted: Stayed 1 semester at [employer] not a good fit and left.  Goiing to [employer] in the fall. Felt worse off the Abilify and restarted at 5 mg 08/12/20 and felt better energy. Doing pretty good, pretty busy.  Couple of classes at [employer] just to get some credit.s OCD is about the same without much change with increase in Lexapro.  No SE noted. Anxiety is fairly high usually associated with obsessive thoughts. Not too much depression. Sleep regular. NO SE.   Not regular Zofran but occ and not sure what causes the nausea. Plan: Reduce Lexapro to 20 ml and add 5 ml fluoxetine for 1 week, Then reduce Lexapro to 10 ml and increase fluoxetine to 10 ml for 1 week, Then stop Lexapro and increase fluoxetine to 15 ml daily  02/16/2021 appointment with the following noted:  Seen with DAD Switched to fluoxetine 15 ml and energy and motivation and mood is better.  Anxiety is a little better but not b much.  OCD is about the same. No problems with the switch in meds.   Hard to tell how much time OCD takes bc Kim [name] can mutifunciton while does compulsions. Avoids contaminants and handwashes but can work at [employer]. No SE.   Not really depressed.   Takes Zofran intermittently a couple times per week can be triggered with caffeine. Father says Kim [name]'s seemed to be doing well and excited about school. Going to [employer] to [employer]. Less tremor. Plan: Option increase fluoxetine to 80 mg daily for OCD.  Kim [name]'d like to  try it.  07/01/2021 appointment with the following noted: Didn't increase bc fluoxetine dose is the same. 60 mg daily. Been doing fairly well except for when off for several weeks.  When on it I feel good. No SE Don't like the quillivant XR bc triggers intrusive SI and when coming off has depression and SI.  Not had this with other stimulants.  Takes it rarely. Still on aripiprazole 5 ml daily. No other SE. Mildly depressed.   Can have a good portion of time with OCD but doesn't always interfere with other things. Still nausea and not sure why.  Not caused by meds. When off Prozac grades went down. Goes back to school Jan 11.  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  Past Psychiatric Medication Trials: History of Tourette's, ADD, OCD History of ADD brief trials Adderall XR 15, guanfacine ER 1 tired, methylphenidate, Strattera Quillevent SI No modafinil. Lexapro 40, sertraline liquid brief hard to swallow,   Fluoxetine 60 better mood Abilify 10 no better than 5 wheich helped. mirtazapine, Wellbutrin  Review of Systems:  Review of Systems  Cardiovascular:  Negative for chest pain and palpitations.  Gastrointestinal:  Positive for nausea.  Neurological:  Negative for dizziness, tremors and weakness.   Medications: I have reviewed the patient's current medications.  Current Outpatient Medications  Medication Sig Dispense Refill   Aripiprazole 1 MG/ML mL take 10 mls BY MOUTH EVERY DAY 300 mL 1   lisdexamfetamine (VYVANSE) 30 MG capsule Take 1 capsule (30 mg total) by mouth daily. 30 capsule 0   ondansetron (ZOFRAN-ODT) 4 MG disintegrating tablet DISSOLVE 1 TABLET BY MOUTH ON TONGUE EVERY DAY     doxycycline (VIBRA-TABS) 100 MG tablet Take 1 tablet (100 mg total) by mouth 2 (two) times daily. (Patient not taking: Reported on 07/01/2021) 20 tablet 0   FLUoxetine (PROZAC) 20 MG/5ML solution Take 20 ML BY MOUTH EVERY DAY 1800 mL 0   No  current facility-administered medications for this visit.    Medication Side Effects: None  Allergies: No Known Allergies  Past Medical History:  Diagnosis Date   Constipation    GERD (gastroesophageal reflux disease)    Granuloma of skin    on forehead    Family History  Problem Relation Age of Onset   GER disease Father    Ulcerative colitis Father    ADD / ADHD Father    Anxiety disorder Father    Depression Father    Bipolar disorder Father    Migraines Paternal Grandmother    Anxiety disorder Paternal Grandmother    Depression Paternal Grandmother    Seizures Neg Hx    Autism Neg Hx    Schizophrenia Neg Hx     Social History   Socioeconomic History   Marital status: Single    Spouse name: n/a   Number of children: 0   Years of education: Not on file   Highest education level: Not on file  Occupational History   Occupation: student  Tobacco Use   Smoking status: Never   Smokeless tobacco: Never  Vaping Use   Vaping Use: Never used  Substance and Sexual Activity   Alcohol use: No   Drug use: No   Sexual activity: Never  Other Topics Concern   Not on file  Social History Narrative   Lives with mom, dad, brother in the same house. Kim [name] is in the 11th grade at [employer]. Does well in school. Kim [name] enjoys listening to music, writing and singing.    Social Determinants of Health   Financial Resource Strain: Not on file  Food Insecurity: Not on file  Transportation Needs: Not on file  Physical Activity: Not on file  Stress: Not on file  Social Connections: Not on file  Intimate Partner Violence: Not on file    Past Medical History, Surgical history, Social history, and Family history were reviewed and updated as appropriate.   Please see review of systems for further details on the patient's review from today.   Objective:   Physical Exam:  BP 109/63    Pulse 86   Physical Exam Constitutional:      General: Kim [name] is not  in acute  distress. Musculoskeletal:        General: No deformity.  Neurological:     Mental Status: Kim [name] is alert and oriented to person, place, and time.     Coordination: Coordination normal.  Psychiatric:        Attention and Perception: Attention and perception normal. Kim [name] does not perceive auditory or visual hallucinations.        Mood and Affect: Mood is anxious and depressed. Affect is not labile, blunt, angry or inappropriate.        Speech: Speech normal.        Behavior: Behavior normal. Behavior is cooperative.        Thought Content: Thought content normal. Thought content is not paranoid or delusional. Thought content does not include homicidal or suicidal ideation. Thought content does not include homicidal plan.        Cognition and Memory: Cognition and memory normal.        Judgment: Judgment normal.     Comments: Insight intact OCD ongoing. More outgoing and less down.  Mild depression Anxiety not much general but the OCD    Lab Review:  No results found for: NA, K, CL, CO2, GLUCOSE, BUN, CREATININE, CALCIUM, PROT, ALBUMIN, AST, ALT, ALKPHOS, BILITOT, GFRNONAA, GFRAA  No results found for: WBC, RBC, HGB, HCT, PLT, MCV, MCH, MCHC, RDW, LYMPHSABS, MONOABS, EOSABS, BASOSABS  No results found for: POCLITH, LITHIUM   No results found for: PHENYTOIN, PHENOBARB, VALPROATE, CBMZ   .res Assessment: Plan:    Tishie was seen today for follow-up and anxiety.  Diagnoses and all orders for this visit:  Mixed obsessional thoughts and acts -     FLUoxetine (PROZAC) 20 MG/5ML solution; Take 20 ML BY MOUTH EVERY DAY  Attention deficit hyperactivity disorder (ADHD), combined type, moderate -     lisdexamfetamine (VYVANSE) 30 MG capsule; Take 1 capsule (30 mg total) by mouth daily.  Tourette's disorder  Dysautonomia ([place])  Difficulty swallowing pills     Greater than 50% of 30 min face to face time with patient was spent on counseling and coordination of care. We discussed Review  Dr. Milana Huntsman notes and suggestions for options.  Review Gene[facility]ight testing dated September 30, 2017.  SLC 6 A4 was long long.  HDR2A was normal genotype. Pharmacokinetic genes included normal 2D6, 3 A4, 2 C9, 2C19, 1A2, and intermediate 2B 6  Disc options switch needs alternative SSRI .  Needs liquid and couldn't tolerate the sertraline. Therefore fluoxetine vs paroxetine disc in detail. Discussed that this is a high dose and discussed serotonergic side effects and serotonin syndrome. increase fluoxetine to 80 mg daily for OCD.  Kim [name]'d like to try it.  Increase to 20 ml daily Disc se and option reduce if needed.  Coffee worsens tremor discussed and Kim [name]'s aware.  Asks for Zofran prn.  Okay Kim [name] will let us know what pharmacy to send to.  Wants to restart ADHD meds for school.  Cannot swallow tablets. Will use Vyvanse 30 mg and Kim [name] can take capsule apart. Call back to report effectivenss in a couple fo weeks  Asks for accomodation for extended deadlines and copy of professor's notes.  Disc this.  Option lamotrigine for SAD and off label OCD.  Also discussed that Zofran can be used off label to potentiate OCD treatment but that is not what were trying to do at this time.  Just asked to have Zofran to use occasionally for nausea because Kim [name] does not have a  PCP in [name] area.  Kim [name] will be returning to [place] this month for Christmas break  Continue Abilify 5 mg Discussed potential metabolic side effects associated with atypical antipsychotics, as well as potential risk for movement side effects. Advised pt to contact office if movement side effects occur.   Disc SE and potential  DDI fluoxetine and Abilify increasing level of Abilify  Increase vitamin D 4000-5000 units daily in winter.  Disc counseling  FU 2-3 mos  Lynder Parents, MD, DFAPA  Please see After Visit Summary for patient specific instructions.  No future appointments.  No orders of the defined types were placed in  this encounter.    -------------------------------

## 2021-07-01 NOTE — Patient Instructions (Signed)
Stop [employer] and you can take the capsule apart and put it in applesauce to swallow.  Call back in about 2 weeks if it is not effective enough and we will increase the dose. Increase fluoxetine to 20 mL daily for OCD

## 2021-07-14 ENCOUNTER — Telehealth: Payer: Self-pay

## 2021-07-14 NOTE — Telephone Encounter (Signed)
Prior Authorization submitted and approved for VYVANSE 30 MG effective 07/06/2021-07/05/2022 with BCBS ID# [id number]

## 2021-07-20 ENCOUNTER — Telehealth: Payer: Self-pay | Admitting: Psychiatry

## 2021-07-20 NOTE — Telephone Encounter (Signed)
Pt LVM the medicine she recently was prescribed by Dr. Clovis Pu worsens her OCD, but is effectived for ADHD, so she wants to continue on it.  Next appt 2/23

## 2021-07-20 NOTE — Telephone Encounter (Signed)
Fyi update on vyvanse

## 2021-07-23 DIAGNOSIS — F900 Attention-deficit hyperactivity disorder, predominantly inattentive type: Secondary | ICD-10-CM | POA: Diagnosis not present

## 2021-07-23 DIAGNOSIS — F422 Mixed obsessional thoughts and acts: Secondary | ICD-10-CM | POA: Diagnosis not present

## 2021-07-23 DIAGNOSIS — F411 Generalized anxiety disorder: Secondary | ICD-10-CM | POA: Diagnosis not present

## 2021-07-27 NOTE — Telephone Encounter (Signed)
LVM to rtc 

## 2021-07-27 NOTE — Telephone Encounter (Signed)
I am glad the Vyvanse is helping her at 30 mg every morning.  I am sorry it is worsening her OCD.  Asked her if she thinks the Vyvanse would still be helpful at a lower dose of 20 mg every morning.  It may not affect the OCD is much at the lower dose.  However if she does not believe that she can keep the benefit of the Vyvanse if the dosages reduced we can leave it at the current dose if that is what she wishes.

## 2021-07-29 NOTE — Telephone Encounter (Signed)
Still unable to reach pt.I LVM asking if she wants to communicate through my chart,or if there is a better time to call.Please transfer her or find out this info when she calls back

## 2021-07-30 DIAGNOSIS — F422 Mixed obsessional thoughts and acts: Secondary | ICD-10-CM | POA: Diagnosis not present

## 2021-07-30 DIAGNOSIS — F900 Attention-deficit hyperactivity disorder, predominantly inattentive type: Secondary | ICD-10-CM | POA: Diagnosis not present

## 2021-07-30 DIAGNOSIS — F411 Generalized anxiety disorder: Secondary | ICD-10-CM | POA: Diagnosis not present

## 2021-08-06 ENCOUNTER — Other Ambulatory Visit: Payer: Self-pay | Admitting: Psychiatry

## 2021-08-06 DIAGNOSIS — F900 Attention-deficit hyperactivity disorder, predominantly inattentive type: Secondary | ICD-10-CM | POA: Diagnosis not present

## 2021-08-06 DIAGNOSIS — F422 Mixed obsessional thoughts and acts: Secondary | ICD-10-CM

## 2021-08-06 DIAGNOSIS — F411 Generalized anxiety disorder: Secondary | ICD-10-CM | POA: Diagnosis not present

## 2021-08-06 IMAGING — US US BREAST*L* LIMITED INC AXILLA
1 series · 4 of 4 positions shown · non-contrast
Comparison: Previous exam(s).

CLINICAL DATA: One year interval follow-up of a likely benign
fibroadenoma involving the UPPER INNER QUADRANT of the LEFT breast.

EXAM:
ULTRASOUND OF THE LEFT BREAST

[Series 1: us breast*left* limited inc axilla · 0.05mm/px · 4 of 4 slices shown]
[im 1/4]
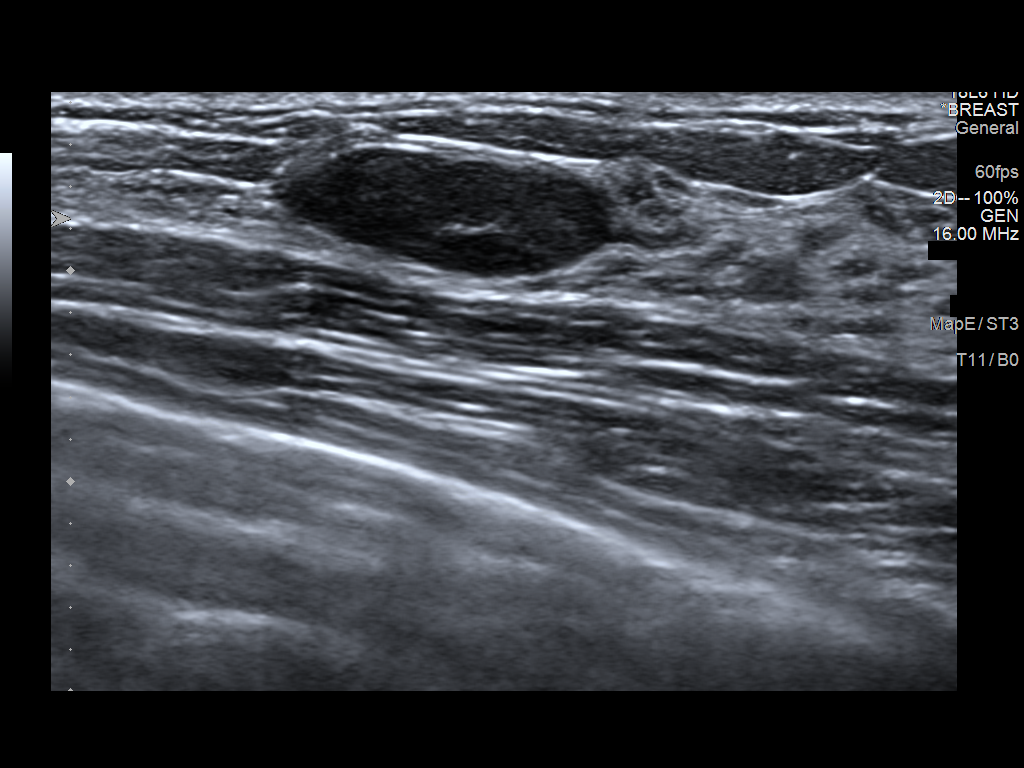
[im 2/4]
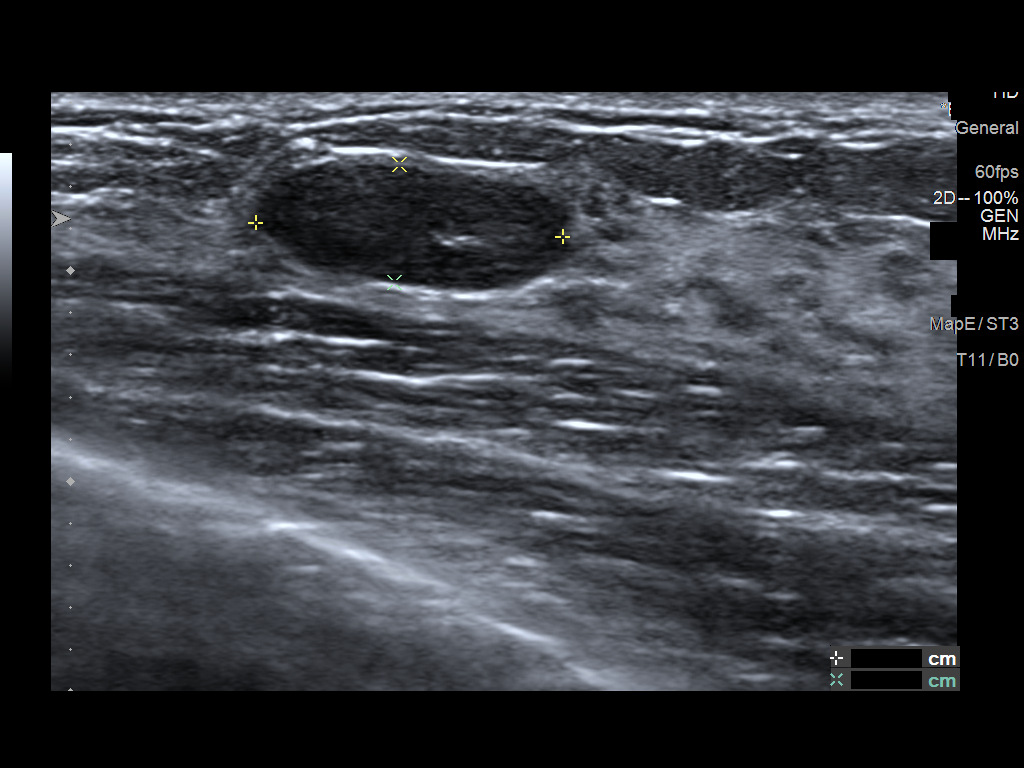
[im 3/4]
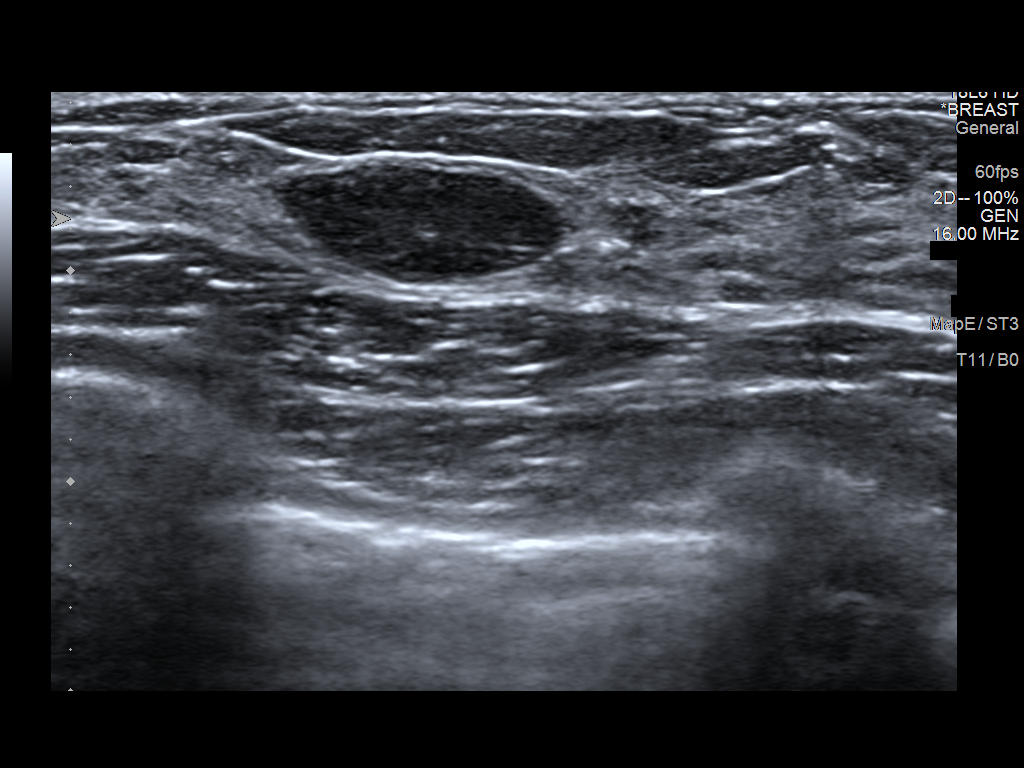
[im 4/4]
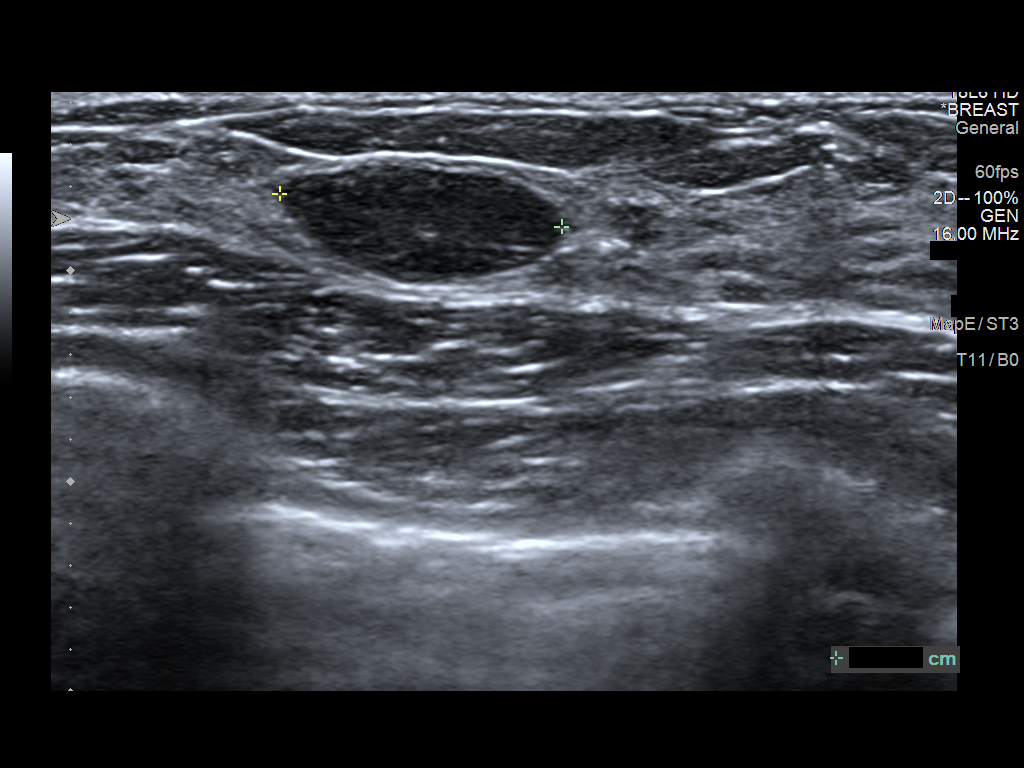

[4 of 4 positions shown; findings below may reference images not displayed]

FINDINGS: The previously identified circumscribed oval parallel hypoechoic
mass at the 11 o'clock position of the LEFT breast approximately 5
cm from the nipple is unchanged in size and appearance, currently
measuring approximately 0.6 x 1.5 x 1.4 cm (previously 0.6 x 1.4 x
1.4 cm on 03/08/2019 and 0.8 x 1.5 x 1.5 cm on 08/25/2018). The mass
demonstrates no posterior characteristics.
IMPRESSION: Stable likely fibroadenoma involving the UPPER INNER QUADRANT of the
LEFT breast.

RECOMMENDATION:
LEFT breast ultrasound in 1 year in order to confirm 2 years of
stability of the likely benign fibroadenoma.

I have discussed the findings and recommendations with the patient.
If applicable, a reminder letter will be sent to the patient
regarding the next appointment.

BI-RADS CATEGORY  3: Probably benign.

## 2021-08-10 ENCOUNTER — Other Ambulatory Visit: Payer: Self-pay | Admitting: Psychiatry

## 2021-08-10 DIAGNOSIS — F422 Mixed obsessional thoughts and acts: Secondary | ICD-10-CM

## 2021-08-12 DIAGNOSIS — S93402A Sprain of unspecified ligament of left ankle, initial encounter: Secondary | ICD-10-CM | POA: Diagnosis not present

## 2021-08-12 DIAGNOSIS — W109XXA Fall (on) (from) unspecified stairs and steps, initial encounter: Secondary | ICD-10-CM | POA: Diagnosis not present

## 2021-08-12 DIAGNOSIS — Y929 Unspecified place or not applicable: Secondary | ICD-10-CM | POA: Diagnosis not present

## 2021-08-12 DIAGNOSIS — F429 Obsessive-compulsive disorder, unspecified: Secondary | ICD-10-CM | POA: Diagnosis not present

## 2021-08-12 DIAGNOSIS — S99912A Unspecified injury of left ankle, initial encounter: Secondary | ICD-10-CM | POA: Diagnosis not present

## 2021-08-12 DIAGNOSIS — M25572 Pain in left ankle and joints of left foot: Secondary | ICD-10-CM | POA: Diagnosis not present

## 2021-08-12 DIAGNOSIS — W108XXA Fall (on) (from) other stairs and steps, initial encounter: Secondary | ICD-10-CM | POA: Diagnosis not present

## 2021-08-13 ENCOUNTER — Telehealth: Payer: Self-pay | Admitting: Psychiatry

## 2021-08-13 ENCOUNTER — Other Ambulatory Visit: Payer: Self-pay

## 2021-08-13 DIAGNOSIS — F411 Generalized anxiety disorder: Secondary | ICD-10-CM | POA: Diagnosis not present

## 2021-08-13 DIAGNOSIS — F422 Mixed obsessional thoughts and acts: Secondary | ICD-10-CM | POA: Diagnosis not present

## 2021-08-13 DIAGNOSIS — F902 Attention-deficit hyperactivity disorder, combined type: Secondary | ICD-10-CM

## 2021-08-13 DIAGNOSIS — F900 Attention-deficit hyperactivity disorder, predominantly inattentive type: Secondary | ICD-10-CM | POA: Diagnosis not present

## 2021-08-13 MED ORDER — LISDEXAMFETAMINE DIMESYLATE 30 MG PO CAPS: 30.0000 mg | ORAL_CAPSULE | Freq: Every day | ORAL | 0 refills | Status: DC

## 2021-08-13 NOTE — Telephone Encounter (Signed)
Next appt is 09/03/21. Requesting a refill on her Vyvanse called to:  [id number] [address], [place]  Phone:  [phone]  Fax:  [phone]

## 2021-08-13 NOTE — Telephone Encounter (Signed)
Pended.

## 2021-08-17 ENCOUNTER — Other Ambulatory Visit: Payer: Self-pay | Admitting: Psychiatry

## 2021-08-17 DIAGNOSIS — F422 Mixed obsessional thoughts and acts: Secondary | ICD-10-CM

## 2021-08-18 DIAGNOSIS — W108XXA Fall (on) (from) other stairs and steps, initial encounter: Secondary | ICD-10-CM | POA: Diagnosis not present

## 2021-08-18 DIAGNOSIS — S93492A Sprain of other ligament of left ankle, initial encounter: Secondary | ICD-10-CM | POA: Diagnosis not present

## 2021-08-20 DIAGNOSIS — F900 Attention-deficit hyperactivity disorder, predominantly inattentive type: Secondary | ICD-10-CM | POA: Diagnosis not present

## 2021-08-20 DIAGNOSIS — F422 Mixed obsessional thoughts and acts: Secondary | ICD-10-CM | POA: Diagnosis not present

## 2021-08-20 DIAGNOSIS — F411 Generalized anxiety disorder: Secondary | ICD-10-CM | POA: Diagnosis not present

## 2021-08-27 DIAGNOSIS — F900 Attention-deficit hyperactivity disorder, predominantly inattentive type: Secondary | ICD-10-CM | POA: Diagnosis not present

## 2021-08-27 DIAGNOSIS — F422 Mixed obsessional thoughts and acts: Secondary | ICD-10-CM | POA: Diagnosis not present

## 2021-08-27 DIAGNOSIS — F411 Generalized anxiety disorder: Secondary | ICD-10-CM | POA: Diagnosis not present

## 2021-09-03 ENCOUNTER — Encounter: Payer: BC Managed Care – PPO | Admitting: Psychiatry

## 2021-09-03 DIAGNOSIS — F900 Attention-deficit hyperactivity disorder, predominantly inattentive type: Secondary | ICD-10-CM | POA: Diagnosis not present

## 2021-09-03 DIAGNOSIS — F411 Generalized anxiety disorder: Secondary | ICD-10-CM | POA: Diagnosis not present

## 2021-09-03 DIAGNOSIS — F422 Mixed obsessional thoughts and acts: Secondary | ICD-10-CM | POA: Diagnosis not present

## 2021-09-03 NOTE — Progress Notes (Incomplete)
Kim [name] [id number] 11/24/2000 21 y.o.   Subjective:   Patient ID:  Kim [name] is a 21 y.o. (DOB [birth year]) female.  Chief Complaint:  No chief complaint on file.   HPI Kim [name] presents today for follow-up of OCD, ADD, depression  02/20/20 appt with the following noted: Seen with mother Kim [name] and father Kim [name].  Rob needing psych help but gave up his appt for daughter to be seen before going to school.  She's going to [place] to school. She has OCD, ADD, Tourette's and depression.  They wonder about other med changes. She needs liquids bc anxiety swallowing pills. Pt doesn't drive DT anxiety.  Would like help with OCD.  Hesitant about ADD meds but poor tolerance with anxiety. Contamination fears with handwashing.  Germs in general and fear of vomtiing so hard to take pills. Handwashes time hard to say but a couple of minutes.  Irritating time.   Triggers include cleaning.   She will avoid hospitals and in general doctors offices. Mo concerned OCD takes a lot of time and attention. She recognizes OCD started in middle school. Parents in support of her going to school  Depressed but not severe.  Not a direct consequence of OCD.  Feels exhausted and not motivated. Sleep about 9 hours.  Energy OK.  Not much to concentrate on.  Hard to read. ADD lack of focus is a problem on reading and is a concern for school.   Had racing heart before stimulants and they made it worse. Can get this with anxiety and get dizzy.  Leaves for school 8/31. [employer] first year.  In [place].    03/07/20 appt with the following noted: Abilify 5 definitely helped with depression. Dep 3/10.   More active.  Better productivity.  Better mood. Some difference with OCD.  Not as big improvement as with depression but better.  Not sure about time spent with OCD.  Anxiety generally 5/10/  Worst times 8/10.   Biggest concern about OCD and school is the potential time involved.  Went  time to do school work sometimes hard to resist OCD.  Not very concerned about OCD affecting her socially.   SE: Some hunger at times.  Restlessness fine now.   Plan: Continue Lexapro at current dose Increase Abilify to 7-8 mg now and if improved then no higher. If no improvement then increase to 10 mg daily.   06/25/2020 appointment with the following noted: Mostly good but not much improvement with OCD.  Depression changing like usual.  Weather affects her and other factors.  History of seasonal depression.  More tired and sleep more.  Annoying but not a huge deal.   Increase in Abilify without sig change.   Finishes exams Thursday and break for a month. No SE.   Able to enjoy things. Handwashing 5 min each time she washes them.  Hard to know how much total time. Tremor before Lexapro.    Plan: Continue Lexapro but increase to 35 mg daily for 2 weeks, then 40 mg daily. High dose medically necessary.  Needs liquid and couldn't tolerate the sertraline. After exams drop Abilify back to 5 mg bc not better with increase to 10 mg.  08/12/2020 telephone call with the following noted: [name] was taken off of Abilify and had her Lexapro increased. Since doing this she is feeling worse, more depressed and lack of energy. She would like to know what she needs to do to help with  this MD response: Her last appointment was 06/25/2020.  Prior to that appointment we had increased Abilify from 5 mg to 10 mg without additional improvement.  Therefore the instructions were to reduce the Abilify back to 5 mg daily.  She was not instructed to discontinue it. Because the increase in Abilify had not been helpful from 5 to 10 mg, in addition to reducing the Abilify back to 5 mg she was to increase the Lexapro from 30 mg to 40 mg. It is unlikely that increasing Lexapro would make her more depressed.  I think the depression is worse because she stopped the Abilify apparently.  The increase in Lexapro could possibly make  her more fatigued but worsening depression could be causing the fatigue.  The benefit from restarting Abilify 5 mg daily should be quick, that is benefit within a week. Therefore I suggest she continue the current dose of Lexapro and add 5 mg of Abilify back.  The depression should improve within a week or so.  If she is not better by February 10 then call us back  11/10/2020 appt noted: Stayed 1 semester at [employer] not a good fit and left.  Goiing to [employer] in the fall. Felt worse off the Abilify and restarted at 5 mg 08/12/20 and felt better energy. Doing pretty good, pretty busy.  Couple of classes at [employer] just to get some credit.s OCD is about the same without much change with increase in Lexapro.  No SE noted. Anxiety is fairly high usually associated with obsessive thoughts. Not too much depression. Sleep regular. NO SE.   Not regular Zofran but occ and not sure what causes the nausea. Plan: Reduce Lexapro to 20 ml and add 5 ml fluoxetine for 1 week, Then reduce Lexapro to 10 ml and increase fluoxetine to 10 ml for 1 week, Then stop Lexapro and increase fluoxetine to 15 ml daily  02/16/2021 appointment with the following noted:  Seen with DAD Switched to fluoxetine 15 ml and energy and motivation and mood is better.  Anxiety is a little better but not b much.  OCD is about the same. No problems with the switch in meds.   Hard to tell how much time OCD takes bc she can mutifunciton while does compulsions. Avoids contaminants and handwashes but can work at [employer]. No SE.   Not really depressed.   Takes Zofran intermittently a couple times per week can be triggered with caffeine. Father says she's seemed to be doing well and excited about school. Going to [employer] to [employer]. Less tremor. Plan: Option increase fluoxetine to 80 mg daily for OCD.  She'd like to try it.  07/01/2021 appointment with the following noted: Didn't increase  bc fluoxetine dose is the same. 60 mg daily. Been doing fairly well except for when off for several weeks.  When on it I feel good. No SE Don't like the quillivant XR bc triggers intrusive SI and when coming off has depression and SI.  Not had this with other stimulants.  Takes it rarely. Still on aripiprazole 5 ml daily. No other SE. Mildly depressed.   Can have a good portion of time with OCD but doesn't always interfere with other things. Still nausea and not sure why.  Not caused by meds. When off Prozac grades went down. Goes back to school Jan 11.  07/20/2021 phone call: Pt LVM the medicine she recently was prescribed by Dr. Clovis Pu worsens her  OCD, but is effectived for ADHD, so she wants to continue on it. MD response:I am glad the Vyvanse is helping her at 30 mg every morning.  I am sorry it is worsening her OCD.  Asked her if she thinks the Vyvanse would still be helpful at a lower dose of 20 mg every morning.  It may not affect the OCD is much at the lower dose.  However if she does not believe that she can keep the benefit of the Vyvanse if the dosages reduced we can leave it at the current dose if that is what she wishes.  09/03/21 appt noted:   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  Past Psychiatric Medication Trials: History of Tourette's, ADD, OCD History of ADD brief trials Adderall XR 15, guanfacine ER 1 tired, methylphenidate, Strattera Quillevent SI No modafinil. Lexapro 40, sertraline liquid brief hard to swallow,   Fluoxetine 60 better mood Abilify 10 no better than 5 wheich helped. mirtazapine, Wellbutrin  Review of Systems:  Review of Systems  Cardiovascular:  Negative for chest pain and palpitations.  Gastrointestinal:  Positive for nausea.  Neurological:  Negative for dizziness, tremors and weakness.   Medications: I have reviewed the patient's current medications.  Current Outpatient Medications  Medication  Sig Dispense Refill   Aripiprazole 1 MG/ML mL take 10 mls BY MOUTH EVERY DAY 300 mL 0   doxycycline (VIBRA-TABS) 100 MG tablet Take 1 tablet (100 mg total) by mouth 2 (two) times daily. (Patient not taking: Reported on 07/01/2021) 20 tablet 0   FLUoxetine (PROZAC) 20 MG/5ML solution Take 15 ML BY MOUTH EVERY DAY 1350 mL 0   lisdexamfetamine (VYVANSE) 30 MG capsule Take 1 capsule (30 mg total) by mouth daily. 30 capsule 0   ondansetron (ZOFRAN-ODT) 4 MG disintegrating tablet DISSOLVE 1 TABLET BY MOUTH ON TONGUE EVERY DAY     No current facility-administered medications for this visit.    Medication Side Effects: None  Allergies: No Known Allergies  Past Medical History:  Diagnosis Date   Constipation    GERD (gastroesophageal reflux disease)    Granuloma of skin    on forehead    Family History  Problem Relation Age of Onset   GER disease Father    Ulcerative colitis Father    ADD / ADHD Father    Anxiety disorder Father    Depression Father    Bipolar disorder Father    Migraines Paternal Grandmother    Anxiety disorder Paternal Grandmother    Depression Paternal Grandmother    Seizures Neg Hx    Autism Neg Hx    Schizophrenia Neg Hx     Social History   Socioeconomic History   Marital status: Single    Spouse name: n/a   Number of children: 0   Years of education: Not on file   Highest education level: Not on file  Occupational History   Occupation: student  Tobacco Use   Smoking status: Never   Smokeless tobacco: Never  Vaping Use   Vaping Use: Never used  Substance and Sexual Activity   Alcohol use: No   Drug use: No   Sexual activity: Never  Other Topics Concern   Not on file  Social History Narrative   Lives with mom, dad, brother in the same house. She is in the 11th grade at [employer]. Does well in school. She enjoys listening to music, writing and singing.    Social Determinants  of Health   Financial Resource  Strain: Not on file  Food Insecurity: Not on file  Transportation Needs: Not on file  Physical Activity: Not on file  Stress: Not on file  Social Connections: Not on file  Intimate Partner Violence: Not on file    Past Medical History, Surgical history, Social history, and Family history were reviewed and updated as appropriate.   Please see review of systems for further details on the patient's review from today.   Objective:   Physical Exam:  There were no vitals taken for this visit.  Physical Exam Constitutional:      General: She is not in acute distress. Musculoskeletal:        General: No deformity.  Neurological:     Mental Status: She is alert and oriented to person, place, and time.     Coordination: Coordination normal.  Psychiatric:        Attention and Perception: Attention and perception normal. She does not perceive auditory or visual hallucinations.        Mood and Affect: Mood is anxious and depressed. Affect is not labile, blunt, angry or inappropriate.        Speech: Speech normal.        Behavior: Behavior normal. Behavior is cooperative.        Thought Content: Thought content normal. Thought content is not paranoid or delusional. Thought content does not include homicidal or suicidal ideation. Thought content does not include homicidal plan.        Cognition and Memory: Cognition and memory normal.        Judgment: Judgment normal.     Comments: Insight intact OCD ongoing. More outgoing and less down.  Mild depression Anxiety not much general but the OCD    Lab Review:  No results found for: NA, K, CL, CO2, GLUCOSE, BUN, CREATININE, CALCIUM, PROT, ALBUMIN, AST, ALT, ALKPHOS, BILITOT, GFRNONAA, GFRAA  No results found for: WBC, RBC, HGB, HCT, PLT, MCV, MCH, MCHC, RDW, LYMPHSABS, MONOABS, EOSABS, BASOSABS  No results found for: POCLITH, LITHIUM   No results found for: PHENYTOIN, PHENOBARB, VALPROATE, CBMZ   .res Assessment: Plan:    There are no  diagnoses linked to this encounter.    Greater than 50% of 30 min face to face time with patient was spent on counseling and coordination of care. We discussed Review Dr. Milana Huntsman notes and suggestions for options.  Review GeneSight testing dated September 30, 2017.  SLC 6 A4 was long long.  HDR2A was normal genotype. Pharmacokinetic genes included normal 2D6, 3 A4, 2 C9, 2C19, 1A2, and intermediate 2B 6  Disc options switch needs alternative SSRI .  Needs liquid and couldn't tolerate the sertraline. Therefore fluoxetine vs paroxetine disc in detail. Discussed that this is a high dose and discussed serotonergic side effects and serotonin syndrome. increase fluoxetine to 80 mg daily for OCD.  She'd like to try it.  Increase to 20 ml daily Disc se and option reduce if needed.  Coffee worsens tremor discussed and she's aware.  Asks for Zofran prn.  Okay she will let us know what pharmacy to send to.  Wants to restart ADHD meds for school.  Cannot swallow tablets. Will use Vyvanse 30 mg and she can take capsule apart. Call back to report effectivenss in a couple fo weeks  Asks for accomodation for extended deadlines and copy of professor's notes.  Disc this.  Option lamotrigine for SAD and off label OCD.  Also discussed  that Zofran can be used off label to potentiate OCD treatment but that is not what were trying to do at this time.  Just asked to have Zofran to use occasionally for nausea because she does not have a PCP in her area.  She will be returning to [place] this month for Christmas break  Continue Abilify 5 mg Discussed potential metabolic side effects associated with atypical antipsychotics, as well as potential risk for movement side effects. Advised pt to contact office if movement side effects occur.   Disc SE and potential  DDI fluoxetine and Abilify increasing level of Abilify  Increase vitamin D 4000-5000 units daily in winter.  Disc counseling  FU 2-3  mos  Lynder Parents, MD, DFAPA  Please see After Visit Summary for patient specific instructions.  No future appointments.  No orders of the defined types were placed in this encounter.    -------------------------------

## 2021-09-05 NOTE — Progress Notes (Signed)
No show for televisit.  Not reached. This encounter was created in error - please disregard.

## 2021-09-10 DIAGNOSIS — F900 Attention-deficit hyperactivity disorder, predominantly inattentive type: Secondary | ICD-10-CM | POA: Diagnosis not present

## 2021-09-10 DIAGNOSIS — F411 Generalized anxiety disorder: Secondary | ICD-10-CM | POA: Diagnosis not present

## 2021-09-10 DIAGNOSIS — F422 Mixed obsessional thoughts and acts: Secondary | ICD-10-CM | POA: Diagnosis not present

## 2021-09-14 ENCOUNTER — Other Ambulatory Visit: Payer: Self-pay | Admitting: Psychiatry

## 2021-09-14 DIAGNOSIS — F422 Mixed obsessional thoughts and acts: Secondary | ICD-10-CM

## 2021-10-01 DIAGNOSIS — F422 Mixed obsessional thoughts and acts: Secondary | ICD-10-CM | POA: Diagnosis not present

## 2021-10-01 DIAGNOSIS — F900 Attention-deficit hyperactivity disorder, predominantly inattentive type: Secondary | ICD-10-CM | POA: Diagnosis not present

## 2021-10-01 DIAGNOSIS — F411 Generalized anxiety disorder: Secondary | ICD-10-CM | POA: Diagnosis not present

## 2021-10-14 ENCOUNTER — Other Ambulatory Visit: Payer: Self-pay | Admitting: Psychiatry

## 2021-10-14 DIAGNOSIS — F422 Mixed obsessional thoughts and acts: Secondary | ICD-10-CM

## 2021-10-15 DIAGNOSIS — F900 Attention-deficit hyperactivity disorder, predominantly inattentive type: Secondary | ICD-10-CM | POA: Diagnosis not present

## 2021-10-15 DIAGNOSIS — F411 Generalized anxiety disorder: Secondary | ICD-10-CM | POA: Diagnosis not present

## 2021-10-15 DIAGNOSIS — F422 Mixed obsessional thoughts and acts: Secondary | ICD-10-CM | POA: Diagnosis not present

## 2021-10-15 NOTE — Telephone Encounter (Signed)
Please call to schedule an appt. Last seen 12/21 with RTC in 2-3 mo.  ?

## 2021-10-15 NOTE — Telephone Encounter (Signed)
Lvm for pt to call and schedule

## 2021-10-22 ENCOUNTER — Other Ambulatory Visit: Payer: Self-pay | Admitting: Psychiatry

## 2021-10-22 ENCOUNTER — Telehealth: Payer: Self-pay | Admitting: Psychiatry

## 2021-10-22 DIAGNOSIS — F902 Attention-deficit hyperactivity disorder, combined type: Secondary | ICD-10-CM

## 2021-10-22 MED ORDER — LISDEXAMFETAMINE DIMESYLATE 30 MG PO CAPS: 30.0000 mg | ORAL_CAPSULE | Freq: Every day | ORAL | 0 refills | Status: DC

## 2021-10-22 NOTE — Telephone Encounter (Signed)
Pt called to request virtual apt 5/11 also canc list. Requesting Rx for Vyvanse 30 mg 1/d. [facility] coming up soon. ?

## 2021-10-22 NOTE — Telephone Encounter (Signed)
Ok Materials engineer.  No need to be on cancellation list unless she is not doing well.  Sent RX ?

## 2021-10-23 ENCOUNTER — Telehealth: Payer: Self-pay | Admitting: Psychiatry

## 2021-10-23 ENCOUNTER — Other Ambulatory Visit: Payer: Self-pay

## 2021-10-23 DIAGNOSIS — F902 Attention-deficit hyperactivity disorder, combined type: Secondary | ICD-10-CM

## 2021-10-23 MED ORDER — LISDEXAMFETAMINE DIMESYLATE 30 MG PO CAPS: 30.0000 mg | ORAL_CAPSULE | Freq: Every day | ORAL | 0 refills | Status: DC

## 2021-10-23 NOTE — Telephone Encounter (Signed)
Patient's mom called in stating that when the called in for refill yesterday they gave the incorrect pharmacy. They would like prescription for Vyvanse '30mg'$  sent instead to [facility] [place] [place]. Ph: [phone] ?

## 2021-10-23 NOTE — Telephone Encounter (Signed)
Canceled Rx at [facility]. Repended Rx to [facility] Clovis Pu for [facility] in [facility].  ?

## 2021-11-04 DIAGNOSIS — F900 Attention-deficit hyperactivity disorder, predominantly inattentive type: Secondary | ICD-10-CM | POA: Diagnosis not present

## 2021-11-04 DIAGNOSIS — F411 Generalized anxiety disorder: Secondary | ICD-10-CM | POA: Diagnosis not present

## 2021-11-04 DIAGNOSIS — F422 Mixed obsessional thoughts and acts: Secondary | ICD-10-CM | POA: Diagnosis not present

## 2021-11-06 DIAGNOSIS — Z91128 Patient's intentional underdosing of medication regimen for other reason: Secondary | ICD-10-CM | POA: Diagnosis not present

## 2021-11-06 DIAGNOSIS — F29 Unspecified psychosis not due to a substance or known physiological condition: Secondary | ICD-10-CM | POA: Diagnosis not present

## 2021-11-06 DIAGNOSIS — F419 Anxiety disorder, unspecified: Secondary | ICD-10-CM | POA: Diagnosis not present

## 2021-11-06 DIAGNOSIS — F909 Attention-deficit hyperactivity disorder, unspecified type: Secondary | ICD-10-CM | POA: Diagnosis not present

## 2021-11-06 DIAGNOSIS — T50992A Poisoning by other drugs, medicaments and biological substances, intentional self-harm, initial encounter: Secondary | ICD-10-CM | POA: Diagnosis not present

## 2021-11-06 DIAGNOSIS — F902 Attention-deficit hyperactivity disorder, combined type: Secondary | ICD-10-CM | POA: Diagnosis not present

## 2021-11-06 DIAGNOSIS — F1210 Cannabis abuse, uncomplicated: Secondary | ICD-10-CM | POA: Diagnosis not present

## 2021-11-06 DIAGNOSIS — Y929 Unspecified place or not applicable: Secondary | ICD-10-CM | POA: Diagnosis not present

## 2021-11-06 DIAGNOSIS — R45851 Suicidal ideations: Secondary | ICD-10-CM | POA: Diagnosis not present

## 2021-11-06 DIAGNOSIS — F422 Mixed obsessional thoughts and acts: Secondary | ICD-10-CM | POA: Diagnosis not present

## 2021-11-06 DIAGNOSIS — Z20822 Contact with and (suspected) exposure to covid-19: Secondary | ICD-10-CM | POA: Diagnosis not present

## 2021-11-06 DIAGNOSIS — J45909 Unspecified asthma, uncomplicated: Secondary | ICD-10-CM | POA: Diagnosis not present

## 2021-11-06 DIAGNOSIS — F39 Unspecified mood [affective] disorder: Secondary | ICD-10-CM | POA: Diagnosis not present

## 2021-11-06 DIAGNOSIS — Y903 Blood alcohol level of 60-79 mg/100 ml: Secondary | ICD-10-CM | POA: Diagnosis not present

## 2021-11-06 DIAGNOSIS — F329 Major depressive disorder, single episode, unspecified: Secondary | ICD-10-CM | POA: Diagnosis not present

## 2021-11-06 DIAGNOSIS — F1010 Alcohol abuse, uncomplicated: Secondary | ICD-10-CM | POA: Diagnosis not present

## 2021-11-06 DIAGNOSIS — T50901A Poisoning by unspecified drugs, medicaments and biological substances, accidental (unintentional), initial encounter: Secondary | ICD-10-CM | POA: Diagnosis not present

## 2021-11-06 DIAGNOSIS — Z789 Other specified health status: Secondary | ICD-10-CM | POA: Diagnosis not present

## 2021-11-06 DIAGNOSIS — T1491XA Suicide attempt, initial encounter: Secondary | ICD-10-CM | POA: Diagnosis not present

## 2021-11-06 DIAGNOSIS — F17210 Nicotine dependence, cigarettes, uncomplicated: Secondary | ICD-10-CM | POA: Diagnosis not present

## 2021-11-06 DIAGNOSIS — F32A Depression, unspecified: Secondary | ICD-10-CM | POA: Diagnosis not present

## 2021-11-06 DIAGNOSIS — Z9152 Personal history of nonsuicidal self-harm: Secondary | ICD-10-CM | POA: Diagnosis not present

## 2021-11-07 ENCOUNTER — Other Ambulatory Visit: Payer: Self-pay | Admitting: Psychiatry

## 2021-11-07 DIAGNOSIS — F422 Mixed obsessional thoughts and acts: Secondary | ICD-10-CM

## 2021-11-16 DIAGNOSIS — F411 Generalized anxiety disorder: Secondary | ICD-10-CM | POA: Diagnosis not present

## 2021-11-16 DIAGNOSIS — F422 Mixed obsessional thoughts and acts: Secondary | ICD-10-CM | POA: Diagnosis not present

## 2021-11-16 DIAGNOSIS — F900 Attention-deficit hyperactivity disorder, predominantly inattentive type: Secondary | ICD-10-CM | POA: Diagnosis not present

## 2021-11-19 ENCOUNTER — Telehealth: Payer: BC Managed Care – PPO | Admitting: Psychiatry

## 2021-11-19 DIAGNOSIS — F331 Major depressive disorder, recurrent, moderate: Secondary | ICD-10-CM | POA: Diagnosis not present

## 2021-11-20 DIAGNOSIS — F331 Major depressive disorder, recurrent, moderate: Secondary | ICD-10-CM | POA: Diagnosis not present

## 2021-11-23 DIAGNOSIS — F331 Major depressive disorder, recurrent, moderate: Secondary | ICD-10-CM | POA: Diagnosis not present

## 2021-11-24 DIAGNOSIS — F331 Major depressive disorder, recurrent, moderate: Secondary | ICD-10-CM | POA: Diagnosis not present

## 2021-11-25 DIAGNOSIS — F331 Major depressive disorder, recurrent, moderate: Secondary | ICD-10-CM | POA: Diagnosis not present

## 2021-11-26 DIAGNOSIS — F331 Major depressive disorder, recurrent, moderate: Secondary | ICD-10-CM | POA: Diagnosis not present

## 2021-11-27 DIAGNOSIS — F331 Major depressive disorder, recurrent, moderate: Secondary | ICD-10-CM | POA: Diagnosis not present

## 2021-11-30 DIAGNOSIS — F331 Major depressive disorder, recurrent, moderate: Secondary | ICD-10-CM | POA: Diagnosis not present

## 2021-12-01 DIAGNOSIS — F331 Major depressive disorder, recurrent, moderate: Secondary | ICD-10-CM | POA: Diagnosis not present

## 2021-12-02 DIAGNOSIS — F331 Major depressive disorder, recurrent, moderate: Secondary | ICD-10-CM | POA: Diagnosis not present

## 2021-12-03 DIAGNOSIS — F331 Major depressive disorder, recurrent, moderate: Secondary | ICD-10-CM | POA: Diagnosis not present

## 2021-12-04 ENCOUNTER — Other Ambulatory Visit: Payer: Self-pay | Admitting: Psychiatry

## 2021-12-04 ENCOUNTER — Telehealth: Payer: Self-pay | Admitting: Psychiatry

## 2021-12-04 DIAGNOSIS — F422 Mixed obsessional thoughts and acts: Secondary | ICD-10-CM

## 2021-12-04 DIAGNOSIS — F902 Attention-deficit hyperactivity disorder, combined type: Secondary | ICD-10-CM

## 2021-12-04 DIAGNOSIS — F331 Major depressive disorder, recurrent, moderate: Secondary | ICD-10-CM | POA: Diagnosis not present

## 2021-12-04 MED ORDER — LISDEXAMFETAMINE DIMESYLATE 30 MG PO CAPS: 30.0000 mg | ORAL_CAPSULE | Freq: Every day | ORAL | 0 refills | Status: DC

## 2021-12-04 MED ORDER — FLUOXETINE HCL 20 MG PO CAPS: 80.0000 mg | ORAL_CAPSULE | Freq: Every day | ORAL | 0 refills | Status: DC

## 2021-12-04 MED ORDER — ARIPIPRAZOLE 5 MG PO TABS: 5.0000 mg | ORAL_TABLET | Freq: Every day | ORAL | 0 refills | Status: DC

## 2021-12-04 NOTE — Telephone Encounter (Signed)
Pt called at 1:50 pm asking for a refill of pills on her abilify vyvanse and prozac. [facility] form [facility] [address]

## 2021-12-04 NOTE — Telephone Encounter (Signed)
Patient asking for meds in pill form. It appears she is taking Vyvanse 30 mg, fluoxetine 80 mg, and Abilify 5 mg daily Prescription sent

## 2021-12-04 NOTE — Progress Notes (Signed)
Patient asking for meds in pill form. It appears she is taking Vyvanse 30 mg, fluoxetine 80 mg, and Abilify 5 mg daily

## 2021-12-08 DIAGNOSIS — F331 Major depressive disorder, recurrent, moderate: Secondary | ICD-10-CM | POA: Diagnosis not present

## 2021-12-09 DIAGNOSIS — F331 Major depressive disorder, recurrent, moderate: Secondary | ICD-10-CM | POA: Diagnosis not present

## 2021-12-10 DIAGNOSIS — F331 Major depressive disorder, recurrent, moderate: Secondary | ICD-10-CM | POA: Diagnosis not present

## 2021-12-11 DIAGNOSIS — F331 Major depressive disorder, recurrent, moderate: Secondary | ICD-10-CM | POA: Diagnosis not present

## 2021-12-14 DIAGNOSIS — F331 Major depressive disorder, recurrent, moderate: Secondary | ICD-10-CM | POA: Diagnosis not present

## 2021-12-15 DIAGNOSIS — F331 Major depressive disorder, recurrent, moderate: Secondary | ICD-10-CM | POA: Diagnosis not present

## 2021-12-16 DIAGNOSIS — F331 Major depressive disorder, recurrent, moderate: Secondary | ICD-10-CM | POA: Diagnosis not present

## 2021-12-17 DIAGNOSIS — F331 Major depressive disorder, recurrent, moderate: Secondary | ICD-10-CM | POA: Diagnosis not present

## 2021-12-18 DIAGNOSIS — F331 Major depressive disorder, recurrent, moderate: Secondary | ICD-10-CM | POA: Diagnosis not present

## 2021-12-23 DIAGNOSIS — F900 Attention-deficit hyperactivity disorder, predominantly inattentive type: Secondary | ICD-10-CM | POA: Diagnosis not present

## 2021-12-23 DIAGNOSIS — F411 Generalized anxiety disorder: Secondary | ICD-10-CM | POA: Diagnosis not present

## 2021-12-23 DIAGNOSIS — F422 Mixed obsessional thoughts and acts: Secondary | ICD-10-CM | POA: Diagnosis not present

## 2021-12-26 ENCOUNTER — Other Ambulatory Visit: Payer: Self-pay | Admitting: Psychiatry

## 2021-12-26 DIAGNOSIS — F422 Mixed obsessional thoughts and acts: Secondary | ICD-10-CM

## 2022-01-04 ENCOUNTER — Encounter: Payer: Self-pay | Admitting: Psychiatry

## 2022-01-04 ENCOUNTER — Ambulatory Visit (INDEPENDENT_AMBULATORY_CARE_PROVIDER_SITE_OTHER): Payer: BC Managed Care – PPO | Admitting: Psychiatry

## 2022-01-04 DIAGNOSIS — F952 Tourette's disorder: Secondary | ICD-10-CM | POA: Diagnosis not present

## 2022-01-04 DIAGNOSIS — F319 Bipolar disorder, unspecified: Secondary | ICD-10-CM

## 2022-01-04 DIAGNOSIS — F422 Mixed obsessional thoughts and acts: Secondary | ICD-10-CM | POA: Diagnosis not present

## 2022-01-04 DIAGNOSIS — G901 Familial dysautonomia [Riley-Day]: Secondary | ICD-10-CM

## 2022-01-04 DIAGNOSIS — R110 Nausea: Secondary | ICD-10-CM

## 2022-01-04 DIAGNOSIS — F902 Attention-deficit hyperactivity disorder, combined type: Secondary | ICD-10-CM

## 2022-01-04 DIAGNOSIS — R198 Other specified symptoms and signs involving the digestive system and abdomen: Secondary | ICD-10-CM

## 2022-01-04 MED ORDER — ONDANSETRON 4 MG PO TBDP: 4.0000 mg | ORAL_TABLET | Freq: Three times a day (TID) | ORAL | 2 refills | Status: DC | PRN

## 2022-01-04 MED ORDER — ARIPIPRAZOLE 15 MG PO TABS: 15.0000 mg | ORAL_TABLET | Freq: Every day | ORAL | 1 refills | Status: DC

## 2022-01-13 ENCOUNTER — Telehealth: Payer: Self-pay | Admitting: Psychiatry

## 2022-01-13 ENCOUNTER — Other Ambulatory Visit: Payer: Self-pay

## 2022-01-13 DIAGNOSIS — F902 Attention-deficit hyperactivity disorder, combined type: Secondary | ICD-10-CM

## 2022-01-13 MED ORDER — LISDEXAMFETAMINE DIMESYLATE 30 MG PO CAPS: 30.0000 mg | ORAL_CAPSULE | Freq: Every day | ORAL | 0 refills | Status: DC

## 2022-01-13 NOTE — Telephone Encounter (Signed)
Pended.

## 2022-01-13 NOTE — Telephone Encounter (Signed)
Kim [name] requesting a refill on the Vyvanse. Patient was last seen on 6/26, with a follow up scheduled for 7/14. Patient can be contacted at # [phone].

## 2022-01-22 ENCOUNTER — Ambulatory Visit (INDEPENDENT_AMBULATORY_CARE_PROVIDER_SITE_OTHER): Payer: BC Managed Care – PPO | Admitting: Psychiatry

## 2022-01-22 ENCOUNTER — Encounter: Payer: Self-pay | Admitting: Psychiatry

## 2022-01-22 DIAGNOSIS — F319 Bipolar disorder, unspecified: Secondary | ICD-10-CM

## 2022-01-22 DIAGNOSIS — F422 Mixed obsessional thoughts and acts: Secondary | ICD-10-CM

## 2022-01-22 DIAGNOSIS — R198 Other specified symptoms and signs involving the digestive system and abdomen: Secondary | ICD-10-CM

## 2022-01-22 DIAGNOSIS — F902 Attention-deficit hyperactivity disorder, combined type: Secondary | ICD-10-CM

## 2022-01-22 DIAGNOSIS — F952 Tourette's disorder: Secondary | ICD-10-CM | POA: Diagnosis not present

## 2022-01-22 DIAGNOSIS — R110 Nausea: Secondary | ICD-10-CM

## 2022-01-22 DIAGNOSIS — G901 Familial dysautonomia [Riley-Day]: Secondary | ICD-10-CM

## 2022-01-22 MED ORDER — OXCARBAZEPINE 150 MG PO TABS: ORAL_TABLET | ORAL | 1 refills | Status: DC

## 2022-01-22 NOTE — Progress Notes (Signed)
KM. FRANCAVILLA [id number] [birth year] 20 y.o.   Subjective:   Patient ID:  Kim Kim is a 21 y.o. (DOB 2001/06/11) female.  Chief Complaint:  Chief Complaint  Patient presents with   Follow-up    Bipolar affective disorder, rapid cycling Kim Kim)   ADHD    HPI Kim Kim presents today for follow-up of OCD, ADD, depression  02/20/20 appt with the following noted: Seen with mother Kim Kim and father Kim Kim.  Kim [name] needing psych help but gave up his appt for daughter to be seen before going to school.  She's going to [employer] to school. She has OCD, ADD, Tourette's and depression.  They wonder about other med changes. She needs liquids bc anxiety swallowing pills. Pt doesn't drive DT anxiety.  Would like help with OCD.  Hesitant about ADD meds but poor tolerance with anxiety. Contamination fears with handwashing.  Germs in general and fear of vomtiing so hard to take pills. Handwashes time hard to say but a couple of minutes.  Irritating time.   Triggers include cleaning.   She will avoid hospitals and in general doctors offices. Mo concerned OCD takes a lot of time and attention. She recognizes OCD started in middle school. Parents in support of her going to school  Depressed but not severe.  Not a direct consequence of OCD.  Feels exhausted and not motivated. Sleep about 9 hours.  Energy OK.  Not much to concentrate on.  Hard to read. ADD lack of focus is a problem on reading and is a concern for school.   Had racing heart before stimulants and they made it worse. Can get this with anxiety and get dizzy.  Leaves for school 8/31. [employer] first year.  In [place].    03/07/20 appt with the following noted: Abilify 5 definitely helped with depression. Dep 3/10.   More active.  Better productivity.  Better mood. Some difference with OCD.  Not as big improvement as with depression but better.  Not sure about time spent with OCD.  Anxiety generally 5/10/  Worst times  8/10.   Biggest concern about OCD and school is the potential time involved.  Went time to do school work sometimes hard to resist OCD.  Not very concerned about OCD affecting her socially.   SE: Some hunger at times.  Restlessness fine now.   Plan: Continue Lexapro at current dose Increase Abilify to 7-8 mg now and if improved then no higher. If no improvement then increase to 10 mg daily.   06/25/2020 appointment with the following noted: Mostly good but not much improvement with OCD.  Depression changing like usual.  Weather affects her and other factors.  History of seasonal depression.  More tired and sleep more.  Annoying but not a huge deal.   Increase in Abilify without sig change.   Finishes exams Thursday and break for a month. No SE.   Able to enjoy things. Handwashing 5 min each time she washes them.  Hard to know how much total time. Tremor before Lexapro.    Plan: Continue Lexapro but increase to 35 mg daily for 2 weeks, then 40 mg daily. High dose medically necessary.  Needs liquid and couldn't tolerate the sertraline. After exams drop Abilify back to 5 mg bc not better with increase to 10 mg.  08/12/2020 telephone call with the following noted: Kim Kim was taken off of Abilify and had her Lexapro increased. Since doing this she is feeling worse, more depressed  and lack of energy. She would like to know what she needs to do to help with this MD response: Her last appointment was 06/25/2020.  Prior to that appointment we had increased Abilify from 5 mg to 10 mg without additional improvement.  Therefore the instructions were to reduce the Abilify back to 5 mg daily.  She was not instructed to discontinue it. Because the increase in Abilify had not been helpful from 5 to 10 mg, in addition to reducing the Abilify back to 5 mg she was to increase the Lexapro from 30 mg to 40 mg. It is unlikely that increasing Lexapro would make her more depressed.  I think the depression is worse because  she stopped the Abilify apparently.  The increase in Lexapro could possibly make her more fatigued but worsening depression could be causing the fatigue.  The benefit from restarting Abilify 5 mg daily should be quick, that is benefit within a week. Therefore I suggest she continue the current dose of Lexapro and add 5 mg of Abilify back.  The depression should improve within a week or so.  If she is not better by February 10 then call us back  11/10/2020 appt noted: Stayed 1 semester at [employer] not a good fit and left.  Goiing to [employer] in the fall. Felt worse off the Abilify and restarted at 5 mg 08/12/20 and felt better energy. Doing pretty good, pretty busy.  Couple of classes at [employer] just to get some credit.s OCD is about the same without much change with increase in Lexapro.  No SE noted. Anxiety is fairly high usually associated with obsessive thoughts. Not too much depression. Sleep regular. NO SE.   Not regular Zofran but occ and not sure what causes the nausea. Plan: Reduce Lexapro to 20 ml and add 5 ml fluoxetine for 1 week, Then reduce Lexapro to 10 ml and increase fluoxetine to 10 ml for 1 week, Then stop Lexapro and increase fluoxetine to 15 ml daily  02/16/2021 appointment with the following noted:  Seen with DAD Switched to fluoxetine 15 ml and energy and motivation and mood is better.  Anxiety is a little better but not b much.  OCD is about the same. No problems with the switch in meds.   Hard to tell how much time OCD takes bc she can mutifunciton while does compulsions. Avoids contaminants and handwashes but can work at [employer]. No SE.   Not really depressed.   Takes Zofran intermittently a couple times per week can be triggered with caffeine. Father says she's seemed to be doing well and excited about school. Going to [employer] to [employer]. Less tremor. Plan: Option increase fluoxetine to 80 mg daily for OCD.  She'd like  to try it.  07/01/2021 appointment with the following noted: Didn't increase bc fluoxetine dose is the same. 60 mg daily. Been doing fairly well except for when off for several weeks.  When on it I feel good. No SE Don't like the quillivant XR bc triggers intrusive SI and when coming off has depression and SI.  Not had this with other stimulants.  Takes it rarely. Still on aripiprazole 5 ml daily. No other SE. Mildly depressed.   Can have a good portion of time with OCD but doesn't always interfere with other things. Still nausea and not sure why.  Not caused by meds. When off Prozac grades went down. Goes back to school Jan 11.  01/04/2022 appointment with the following noted: Psychiatric hospitalization 11/06/2021 until 11/12/2021 at [facility] in [place] for depression with suicidal ideation after drinking alcohol with a bottle of cough syrup and use of marijuana as well as noncompliance with psychiatric medications. Prescribed Abilify 5 mg daily along with fluoxetine 40 mg daily She's at home. She doesn't remember much about how she was doing then.  She had stopped meds a month or 2 before hosp but not sure why.  Before hosp was a few drinks 4 nights a week.  With drew from 1 class and passed the other 2.  Was smoking marijuana not daily but soemtimes multiple times per day.  Sometimes missed classes from oversleeping.  No opiates.   I think I might be bipolar bc was careening back and forth emotionally. Didn't sleep last night. Done some research on bipolar 2 including hyperactivity, with bursts of creativity and not sleeping. Also hypertalkative and hypersexual and risk taking and grandiose thinking.  Upswings might last days.  Mixed states also. Distinct depressions might last weeks.  Not much normal mood. Been at beach with friend last week.  No comments from family or friend. F thinks she might be bipolar. Has compulsions but not anxious about it. Handwashing a lot less than in the  past. No panic and no avoidance. Had anxiety about classes bc wasn't prepared enough. Swallowing is better now. Nausea for years without pattern. Consistent with meds. Consistent with Abilify 5 and fluoxetine 80 Plan: She elects to increase Abilify to 10 mg daily.  01/22/22 appt noted: Increase Abilify 10 mg daily with SE mouth tics and reduced to 5 mg daily.  No one commented.  Moving around jaw would cause some pain soreness..  better on lower dose re: SE. Reduced just recently. Mood has been high and probably hypomanic or manic. Sometimes hears people knock on window or hears her name.  Weird. Staying up late and some EMA.  Avg 4-6 hours per night.  Some nights no sleep.   Mood and confidence and energy increase.  Always a bad spender, some increased risk taking behavior.  More talkative. Anxiety has been alright and not as bad as it was.   Nausea but no other sx physically.  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 Previous [employer] without help.  GF OCD. F bipolar  Past Psychiatric Medication Trials: History of Tourette's, ADD, OCD History of ADD brief trials Adderall XR 15, guanfacine ER 1 tired, methylphenidate, Strattera Quillevent SI No modafinil. Lexapro 40, sertraline liquid brief hard to swallow,   Fluoxetine 60 better mood mirtazapine, Wellbutrin Abilify 10 no better than 5 wheich helped.   Review of Systems:  Review of Systems  Cardiovascular:  Negative for chest pain and palpitations.  Gastrointestinal:  Positive for nausea.  Neurological:  Negative for dizziness and tremors.    Medications: I have reviewed the patient's current medications.  Current Outpatient Medications  Medication Sig Dispense Refill   ARIPiprazole (ABILIFY) 5 MG tablet Take 5 mg by mouth daily. 1 tab daily     FLUoxetine (PROZAC) 20 MG capsule TAKE 4 CAPSULES BY MOUTH DAILY. (Patient taking differently: Take 60 mg by mouth daily.) 120 capsule 0   lisdexamfetamine (VYVANSE) 30 MG  capsule Take 1 capsule (30 mg total) by mouth daily. 30 capsule 0   OXcarbazepine (TRILEPTAL) 150 MG tablet 1 at night for 2 nights, then 1 twice daily for 4 days, then 1 in the AM and 2 at night 45 tablet 1  ondansetron (ZOFRAN-ODT) 4 MG disintegrating tablet TAKE 1 TABLET BY MOUTH EVERY 8 HOURS AS NEEDED FOR NAUSEA AND VOMITING 20 tablet 2   No current facility-administered medications for this visit.    Medication Side Effects: None  Allergies: No Known Allergies  Past Medical History:  Diagnosis Date   Constipation    GERD (gastroesophageal reflux disease)    Granuloma of skin    on forehead    Family History  Problem Relation Age of Onset   GER disease Father    Ulcerative colitis Father    ADD / ADHD Father    Anxiety disorder Father    Depression Father    Bipolar disorder Father    Migraines Paternal Grandmother    Anxiety disorder Paternal Grandmother    Depression Paternal Grandmother    Seizures Neg Hx    Autism Neg Hx    Schizophrenia Neg Hx     Social History   Socioeconomic History   Marital status: Single    Spouse name: n/a   Number of children: 0   Years of education: Not on file   Highest education level: Not on file  Occupational History   Occupation: student  Tobacco Use   Smoking status: Never   Smokeless tobacco: Never  Vaping Use   Vaping Use: Never used  Substance and Sexual Activity   Alcohol use: No   Drug use: No   Sexual activity: Never  Other Topics Concern   Not on file  Social History Narrative   Lives with mom, dad, brother in the same house. She is in the 11th grade at [employer] Does well in school. She enjoys listening to music, writing and singing.    Social Determinants of Health   Financial Resource Strain: Not on file  Food Insecurity: Not on file  Transportation Needs: Not on file  Physical Activity: Not on file  Stress: Not on file  Social Connections: Not on file  Intimate Partner Violence: Not on file     Past Medical History, Surgical history, Social history, and Family history were reviewed and updated as appropriate.   Please see review of systems for further details on the patient's review from today.   Objective:   Physical Exam:  There were no vitals taken for this visit.  Physical Exam Constitutional:      General: She is not in acute distress. Musculoskeletal:        General: No deformity.  Neurological:     Mental Status: She is alert and oriented to person, place, and time.     Coordination: Coordination normal.  Psychiatric:        Attention and Perception: Attention and perception normal. She does not perceive auditory or visual hallucinations.        Mood and Affect: Mood is not anxious or depressed. Affect is not labile, blunt, angry or inappropriate.        Speech: Speech normal.        Behavior: Behavior normal. Behavior is cooperative.        Thought Content: Thought content normal. Thought content is not paranoid or delusional. Thought content does not include homicidal or suicidal ideation.        Cognition and Memory: Cognition and memory normal.        Judgment: Judgment normal.     Comments: Insight intact OCD ongoing. More outgoing and less down.   Anxiety not much general but the OCD Some hypomanic sx  Lab Review:  No results found for: "NA", "K", "CL", "CO2", "GLUCOSE", "BUN", "CREATININE", "CALCIUM", "PROT", "ALBUMIN", "AST", "ALT", "ALKPHOS", "BILITOT", "GFRNONAA", "GFRAA"  No results found for: "WBC", "RBC", "HGB", "HCT", "PLT", "MCV", "MCH", "MCHC", "RDW", "LYMPHSABS", "MONOABS", "EOSABS", "BASOSABS"  No results found for: "POCLITH", "LITHIUM"   No results found for: "PHENYTOIN", "PHENOBARB", "VALPROATE", "CBMZ"   Review GeneSight testing dated September 30, 2017.  SLC 6 A4 was long long.  HDR2A was normal genotype. Pharmacokinetic genes included normal 2D6, 3 A4, 2 C9, 2C19, 1A2, and intermediate 2B 6  .res Assessment: Plan:    Kim Kim  was seen today for follow-up and adhd.  Diagnoses and all orders for this visit:  Bipolar affective disorder, rapid cycling (HCC)  Mixed obsessional thoughts and acts  Attention deficit hyperactivity disorder (ADHD), combined type, moderate  Tourette's disorder  Dysautonomia (HCC)  Difficulty swallowing pills  Chronic nausea  Other orders -     OXcarbazepine (TRILEPTAL) 150 MG tablet; 1 at night for 2 nights, then 1 twice daily for 4 days, then 1 in the AM and 2 at night     Greater than 50% of 30 min face to face time with patient was spent on counseling and coordination of care.  Extended time because required because of recent hospitalization and additional psychiatric diagnosis.  Psychiatric hospitalization 11/06/2021 until 11/12/2021 at [facility] in [place] for depression with suicidal ideation after drinking alcohol with a bottle of cough syrup and use of marijuana as well as noncompliance with psychiatric medications. Prescribed Abilify 5 mg daily along with fluoxetine 40 mg daily  Discussed her concerns that she may have bipolar disorder type II, most recent hypomania.  She has done some reading on the subject.  That appears to be accurate.  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  We discussed how the presence of SSRIs used for her OCD may increase the risk of rapid cycling.  However there is not a good alternative to SSRIs for OCD. Extensive discussion about alternative types of mood stabilizers including lithium, Depakote and atypical antipsychotics.  Lithium is not typically effective for rapid cycling.  We discussed the option of Depakote.   Had EPS with incrased Abilify to 10 mg daily.   Needed the option of alternative mood stabilizer. Traditional mood stabilizers would be lithium and Depakote but they do have more side effect risk than some other options.  She is not severely manic.  We will try milder option of Trileptal and increase to 150 mg  every morning and 300 mg nightly.  She was informed this may not be sufficient to manage symptoms and it may need to be increased. Because of "mouth tics" would like to avoid antipsychotics if possible.  No abnormal involuntary movements were observed in the office and she was examined. Aims score was 0  Due to rapid cycling we have reduced the fluoxetine to 60 mg daily.  She has learned to swallow capsules while at the [employer].  We discussed the risk of worsening OCD at the lower dose  Coffee worsens tremor discussed and she's aware.  Asks for Zofran prn.  Okay she will let us know what pharmacy to send to.  Discussed the importance of avoiding marijuana and alcohol abuse at this time  Asks for accomodation for extended deadlines and copy of professor's notes.  Disc this.  We discussed her father's psychiatric meds and the potential for use of lamotrigine.  We will we will defer this choice  at this time in order to keep medications as simple as possible  Discussed potential metabolic side effects associated with atypical antipsychotics, as well as potential risk for movement side effects. Advised pt to contact office if movement side effects occur.   Disc SE and potential  DDI fluoxetine and Abilify increasing level of Abilify  vitamin D 4000-5000 units daily in winter.  Disc counseling  Asks for school accommodations, loser attendance policy, copies of notes, break up deadlines into smaller deadlines DT depressive ipisodes, difficulty with ADD and concentration on lectures,  ADD causing her to get overwhelmed with large projects.@ [employer] Return to school 8/19  Call if symptoms worsen or she has significant side effects.  FU 3 weeks  Meredith Staggers, MD, DFAPA  Please see After Visit Summary for patient specific instructions.  Future Appointments  Date Time Provider Department Center  02/12/2022 11:00 AM Cottle, Steva Ready., MD [facility] None     No orders of the  defined types were placed in this encounter.    -------------------------------

## 2022-01-22 NOTE — Patient Instructions (Signed)
Nurse Traci for accommodations information

## 2022-01-23 ENCOUNTER — Other Ambulatory Visit: Payer: Self-pay | Admitting: Psychiatry

## 2022-01-23 DIAGNOSIS — R110 Nausea: Secondary | ICD-10-CM

## 2022-01-23 DIAGNOSIS — R198 Other specified symptoms and signs involving the digestive system and abdomen: Secondary | ICD-10-CM

## 2022-01-26 ENCOUNTER — Other Ambulatory Visit: Payer: Self-pay | Admitting: Psychiatry

## 2022-01-26 DIAGNOSIS — F422 Mixed obsessional thoughts and acts: Secondary | ICD-10-CM

## 2022-01-26 NOTE — Telephone Encounter (Signed)
LVM to [facility] - how is she taking, Rx is for 120 qd, note last visit that she is taking 60 qd

## 2022-02-12 ENCOUNTER — Encounter: Payer: Self-pay | Admitting: Psychiatry

## 2022-02-12 ENCOUNTER — Ambulatory Visit (INDEPENDENT_AMBULATORY_CARE_PROVIDER_SITE_OTHER): Payer: BC Managed Care – PPO | Admitting: Psychiatry

## 2022-02-12 ENCOUNTER — Other Ambulatory Visit: Payer: Self-pay | Admitting: Psychiatry

## 2022-02-12 ENCOUNTER — Telehealth: Payer: Self-pay | Admitting: Psychiatry

## 2022-02-12 DIAGNOSIS — F422 Mixed obsessional thoughts and acts: Secondary | ICD-10-CM | POA: Diagnosis not present

## 2022-02-12 DIAGNOSIS — F902 Attention-deficit hyperactivity disorder, combined type: Secondary | ICD-10-CM

## 2022-02-12 DIAGNOSIS — R198 Other specified symptoms and signs involving the digestive system and abdomen: Secondary | ICD-10-CM

## 2022-02-12 DIAGNOSIS — F319 Bipolar disorder, unspecified: Secondary | ICD-10-CM | POA: Diagnosis not present

## 2022-02-12 DIAGNOSIS — G901 Familial dysautonomia [Riley-Day]: Secondary | ICD-10-CM

## 2022-02-12 DIAGNOSIS — F952 Tourette's disorder: Secondary | ICD-10-CM

## 2022-02-12 DIAGNOSIS — R110 Nausea: Secondary | ICD-10-CM

## 2022-02-12 MED ORDER — TRAZODONE HCL 50 MG PO TABS: 50.0000 mg | ORAL_TABLET | Freq: Every evening | ORAL | 0 refills | Status: AC | PRN

## 2022-02-12 MED ORDER — LITHIUM CARBONATE ER 300 MG PO TBCR: EXTENDED_RELEASE_TABLET | ORAL | 1 refills | Status: DC

## 2022-02-12 MED ORDER — ONDANSETRON 8 MG PO TBDP: 8.0000 mg | ORAL_TABLET | Freq: Three times a day (TID) | ORAL | 0 refills | Status: DC | PRN

## 2022-02-12 NOTE — Telephone Encounter (Signed)
Pt called and said that she forgot to tell dr. Clovis Pu 2 things. One is she would like an increase on zofran. She also is having trouble falling asleep. So she would like something for to help her. Please call her at [phone]

## 2022-02-12 NOTE — Telephone Encounter (Signed)
Pt informed

## 2022-02-12 NOTE — Telephone Encounter (Signed)
I will agree to increasing the Zofran to 8 mg. Regarding her sleep issues, she told me in the office that she was sleeping too much.  Sleeping too much will cause her to have trouble falling asleep.  If she is sleeping too much no sleep medicine will help her but assuming she gets on a more normal schedule such as when she restarts school in a couple of weeks I will agree to trazodone 1 at night as needed for sleep.  It is not habit-forming or addicting but is usually quite effective.  That is the most popular prescribed sleep med

## 2022-02-12 NOTE — Progress Notes (Signed)
Kim [name] [id number] [birth year] 21 y.o.   Subjective:   Patient ID:  Kim [name] is a 21 y.o. (DOB July 22, 2000) female.  Chief Complaint:  Chief Complaint  Patient presents with   Follow-up    Bipolar affective disorder, rapid cycling [facility])   ADHD   Anxiety    HPI [name] presents today for follow-up of OCD, ADD, depression  02/20/20 appt with the following noted: Seen with mother [name] and father [name].  Rob needing psych help but gave up his appt for daughter to be seen before going to school. She's going to [place] to school. She has OCD, ADD, Tourette's and depression.  They wonder about other med changes. She needs liquids bc anxiety swallowing pills. Pt doesn't drive DT anxiety. Would like help with OCD.  Hesitant about ADD meds but poor tolerance with anxiety. Contamination fears with handwashing.  Germs in general and fear of vomtiing so hard to take pills. Handwashes time hard to say but a couple of minutes.  Irritating time.   Triggers include cleaning.   She will avoid hospitals and in general doctors offices. Mo concerned OCD takes a lot of time and attention. She recognizes OCD started in middle school. Parents in support of her going to school Depressed but not severe.  Not a direct consequence of OCD.  Feels exhausted and not motivated. Sleep about 9 hours.  Energy OK.  Not much to concentrate on.  Hard to read. ADD lack of focus is a problem on reading and is a concern for school.  Had racing heart before stimulants and they made it worse. Can get this with anxiety and get dizzy. Leaves for school 8/31. [employer] first year.  In [place].    03/07/20 appt with the following noted: Abilify 5 definitely helped with depression. Dep 3/10.   More active.  Better productivity.  Better mood. Some difference with OCD.  Not as big improvement as with depression but better.  Not sure about time spent with OCD.  Anxiety generally 5/10/  Worst times  8/10.   Biggest concern about OCD and school is the potential time involved.  Went time to do school work sometimes hard to resist OCD.  Not very concerned about OCD affecting her socially.   SE: Some hunger at times.  Restlessness fine now.   Plan: Continue Lexapro at current dose Increase Abilify to 7-8 mg now and if improved then no higher. If no improvement then increase to 10 mg daily.   06/25/2020 appointment with the following noted: Mostly good but not much improvement with OCD.  Depression changing like usual.  Weather affects her and other factors.  History of seasonal depression.  More tired and sleep more.  Annoying but not a huge deal.   Increase in Abilify without sig change.   Finishes exams Thursday and break for a month. No SE.   Able to enjoy things. Handwashing 5 min each time she washes them.  Hard to know how much total time. Tremor before Lexapro.    Plan: Continue Lexapro but increase to 35 mg daily for 2 weeks, then 40 mg daily. High dose medically necessary.  Needs liquid and couldn't tolerate the sertraline. After exams drop Abilify back to 5 mg bc not better with increase to 10 mg.  08/12/2020 telephone call with the following noted: [name] was taken off of Abilify and had her Lexapro increased. Since doing this she is feeling worse, more depressed and lack  of energy. She would like to know what she needs to do to help with this MD response: Her last appointment was 06/25/2020.  Prior to that appointment we had increased Abilify from 5 mg to 10 mg without additional improvement.  Therefore the instructions were to reduce the Abilify back to 5 mg daily.  She was not instructed to discontinue it. Because the increase in Abilify had not been helpful from 5 to 10 mg, in addition to reducing the Abilify back to 5 mg she was to increase the Lexapro from 30 mg to 40 mg. It is unlikely that increasing Lexapro would make her more depressed.  I think the depression is worse because  she stopped the Abilify apparently.  The increase in Lexapro could possibly make her more fatigued but worsening depression could be causing the fatigue.  The benefit from restarting Abilify 5 mg daily should be quick, that is benefit within a week. Therefore I suggest she continue the current dose of Lexapro and add 5 mg of Abilify back.  The depression should improve within a week or so.  If she is not better by February 10 then call us back  11/10/2020 appt noted: Stayed 1 semester at [employer] not a good fit and left.  Goiing to [employer] in the fall. Felt worse off the Abilify and restarted at 5 mg 08/12/20 and felt better energy. Doing pretty good, pretty busy.  Couple of classes at [employer] just to get some credit.s OCD is about the same without much change with increase in Lexapro.  No SE noted. Anxiety is fairly high usually associated with obsessive thoughts. Not too much depression. Sleep regular. NO SE.   Not regular Zofran but occ and not sure what causes the nausea. Plan: Reduce Lexapro to 20 ml and add 5 ml fluoxetine for 1 week, Then reduce Lexapro to 10 ml and increase fluoxetine to 10 ml for 1 week, Then stop Lexapro and increase fluoxetine to 15 ml daily  02/16/2021 appointment with the following noted:  Seen with DAD Switched to fluoxetine 15 ml and energy and motivation and mood is better.  Anxiety is a little better but not b much.  OCD is about the same. No problems with the switch in meds.   Hard to tell how much time OCD takes bc she can mutifunciton while does compulsions. Avoids contaminants and handwashes but can work at [employer]. No SE.   Not really depressed.   Takes Zofran intermittently a couple times per week can be triggered with caffeine. Father says she's seemed to be doing well and excited about school. Going to [employer] to [employer]. Less tremor. Plan: Option increase fluoxetine to 80 mg daily for OCD.  She'd like  to try it.  07/01/2021 appointment with the following noted: Didn't increase bc fluoxetine dose is the same. 60 mg daily. Been doing fairly well except for when off for several weeks.  When on it I feel good. No SE Don't like the quillivant XR bc triggers intrusive SI and when coming off has depression and SI.  Not had this with other stimulants.  Takes it rarely. Still on aripiprazole 5 ml daily. No other SE. Mildly depressed.   Can have a good portion of time with OCD but doesn't always interfere with other things. Still nausea and not sure why.  Not caused by meds. When off Prozac grades went down. Goes back to school Jan 11.  01/04/2022  appointment with the following noted: Psychiatric hospitalization 11/06/2021 until 11/12/2021 at [facility] in [place] for depression with suicidal ideation after drinking alcohol with a bottle of cough syrup and use of marijuana as well as noncompliance with psychiatric medications. Prescribed Abilify 5 mg daily along with fluoxetine 40 mg daily She's at home. She doesn't remember much about how she was doing then.  She had stopped meds a month or 2 before hosp but not sure why.  Before hosp was a few drinks 4 nights a week.  With drew from 1 class and passed the other 2.  Was smoking marijuana not daily but soemtimes multiple times per day.  Sometimes missed classes from oversleeping.  No opiates.   I think I might be bipolar bc was careening back and forth emotionally. Didn't sleep last night. Done some research on bipolar 2 including hyperactivity, with bursts of creativity and not sleeping. Also hypertalkative and hypersexual and risk taking and grandiose thinking.  Upswings might last days.  Mixed states also. Distinct depressions might last weeks.  Not much normal mood. Been at beach with friend last week.  No comments from family or friend. F thinks she might be bipolar. Has compulsions but not anxious about it. Handwashing a lot less than in the  past. No panic and no avoidance. Had anxiety about classes bc wasn't prepared enough. Swallowing is better now. Nausea for years without pattern. Consistent with meds. Consistent with Abilify 5 and fluoxetine 80 Plan: She elects to increase Abilify to 10 mg daily.  01/22/22 appt noted: Increase Abilify 10 mg daily with SE mouth tics and reduced to 5 mg daily.  No one commented.  Moving around jaw would cause some pain soreness..  better on lower dose re: SE. Reduced just recently. Mood has been high and probably hypomanic or manic. Sometimes hears people knock on window or hears her name.  Weird. Staying up late and some EMA.  Avg 4-6 hours per night.  Some nights no sleep.   Mood and confidence and energy increase.  Always a bad spender, some increased risk taking behavior.  More talkative. Anxiety has been alright and not as bad as it was.   Nausea but no other sx physically. Plan: Trileptal and increase to 150 mg every morning and 300 mg nightly.   Reduced fluxoeine to 60 DT rapid cycling Reduced Abilify to 5 mg daily. Continue Vyvanse 30  02/12/2022 appointment with the following noted: Not taking Trilepotal, stopped Trileptal DT increase SI so stopped.  Took it a few days. Still having some SI.  Not all the time but not too strong bc no plan.  If sees knife will have the thought.   Off Abilify. Has been depressed since after vacation.  Low energy and mood, little motivation.  Neg self talk. No irritability or anger. Sleep too much 10+ hours.  No napping. Not doing much.   At the beach was feeling fine.   Main stress, boredom and loneliness. Planning to return to school and feels capable.  Benefit structure and friends. Not much anxiety or OCD. Mouth tics resolved. Persistent N and lost 10# bc hard to eat.  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 F bipolar lithium with benefit.  Past Psychiatric Medication Trials: History of  Tourette's, ADD, OCD History of ADD brief trials Adderall XR 15, guanfacine ER 1 tired, methylphenidate, Strattera Quillevent SI No modafinil. Lexapro 40, sertraline liquid brief hard to swallow,  Fluoxetine 60 better mood mirtazapine, Wellbutrin Abilify 10 EPS & no better than 5 wheich helped. Trileptal brief increase SI  Review of Systems:  Review of Systems  Cardiovascular:  Negative for palpitations.  Gastrointestinal:  Positive for nausea.  Neurological:  Negative for dizziness and tremors.    Medications: I have reviewed the patient's current medications.  Current Outpatient Medications  Medication Sig Dispense Refill   FLUoxetine (PROZAC) 20 MG capsule Take 3 capsules (60 mg total) by mouth daily. 270 capsule 0   lisdexamfetamine (VYVANSE) 30 MG capsule Take 1 capsule (30 mg total) by mouth daily. 30 capsule 0   lithium carbonate (LITHOBID) 300 MG CR tablet 1 at night for 4 nights then 2 at night 60 tablet 1   ondansetron (ZOFRAN-ODT) 8 MG disintegrating tablet Take 1 tablet (8 mg total) by mouth every 8 (eight) hours as needed for nausea or vomiting. 20 tablet 0   traZODone (DESYREL) 50 MG tablet Take 1 tablet (50 mg total) by mouth at bedtime as needed for sleep. 30 tablet 0   No current facility-administered medications for this visit.    Medication Side Effects: None  Allergies: No Known Allergies  Past Medical History:  Diagnosis Date   Constipation    GERD (gastroesophageal reflux disease)    Granuloma of skin    on forehead    Family History  Problem Relation Age of Onset   GER disease Father    Ulcerative colitis Father    ADD / ADHD Father    Anxiety disorder Father    Depression Father    Bipolar disorder Father    Migraines Paternal Grandmother    Anxiety disorder Paternal Grandmother    Depression Paternal Grandmother    Seizures Neg Hx    Autism Neg Hx    Schizophrenia Neg Hx     Social History   Socioeconomic History   Marital status:  Single    Spouse name: n/a   Number of children: 0   Years of education: Not on file   Highest education level: Not on file  Occupational History   Occupation: student  Tobacco Use   Smoking status: Never   Smokeless tobacco: Never  Vaping Use   Vaping Use: Never used  Substance and Sexual Activity   Alcohol use: No   Drug use: No   Sexual activity: Never  Other Topics Concern   Not on file  Social History Narrative   Lives with mom, dad, brother in the same house. She is in the 11th grade at [employer] Does well in school. She enjoys listening to music, writing and singing.    Social Determinants of Health   Financial Resource Strain: Not on file  Food Insecurity: Not on file  Transportation Needs: Not on file  Physical Activity: Not on file  Stress: Not on file  Social Connections: Not on file  Intimate Partner Violence: Not on file    Past Medical History, Surgical history, Social history, and Family history were reviewed and updated as appropriate.   Please see review of systems for further details on the patient's review from today.   Objective:   Physical Exam:  There were no vitals taken for this visit.  Physical Exam Musculoskeletal:        General: No deformity.  Neurological:     Mental Status: She is alert and oriented to person, place, and time.     Coordination: Coordination normal.  Psychiatric:  Attention and Perception: Attention and perception normal. She does not perceive auditory or visual hallucinations.        Mood and Affect: Mood is depressed. Mood is not anxious. Affect is not labile, blunt, angry or inappropriate.        Speech: Speech normal.        Behavior: Behavior normal. Behavior is cooperative.        Thought Content: Thought content is not paranoid or delusional. Thought content includes suicidal ideation. Thought content does not include homicidal ideation. Thought content does not include suicidal plan.        Cognition  and Memory: Cognition and memory normal.        Judgment: Judgment normal.     Comments: Insight intact OCD minimal Anxiety not much  More SI without intent plan. Some thoughts are intrusive.     Lab Review:  No results found for: "NA", "K", "CL", "CO2", "GLUCOSE", "BUN", "CREATININE", "CALCIUM", "PROT", "ALBUMIN", "AST", "ALT", "ALKPHOS", "BILITOT", "GFRNONAA", "GFRAA"  No results found for: "WBC", "RBC", "HGB", "HCT", "PLT", "MCV", "MCH", "MCHC", "RDW", "LYMPHSABS", "MONOABS", "EOSABS", "BASOSABS"  No results found for: "POCLITH", "LITHIUM"   No results found for: "PHENYTOIN", "PHENOBARB", "VALPROATE", "CBMZ"   Review [facility] testing dated September 30, 2017.  SLC 6 A4 was long long.  HDR2A was normal genotype. Pharmacokinetic genes included normal 2D6, 3 A4, 2 C9, 2C19, 1A2, and intermediate 2B 6  .res Assessment: Plan:    Adena was seen today for follow-up, adhd and anxiety.  Diagnoses and all orders for this visit:  Bipolar affective disorder, rapid cycling (HCC) -     lithium carbonate (LITHOBID) 300 MG CR tablet; 1 at night for 4 nights then 2 at night  Mixed obsessional thoughts and acts  Attention deficit hyperactivity disorder (ADHD), combined type, moderate  Tourette's disorder  Dysautonomia ([place])  Difficulty swallowing pills  Chronic nausea   Greater than 50% of 30 min face to face time with patient was spent on counseling and coordination of care.  Extended time because required because of recent hospitalization and additional psychiatric diagnosis.  Psychiatric hospitalization 11/06/2021 until 11/12/2021 at [facility] in [place] for depression with suicidal ideation after drinking alcohol with a bottle of cough syrup and use of marijuana as well as noncompliance with psychiatric medications. Prescribed Abilify 5 mg daily along with fluoxetine 40 mg daily.Had EPS with incrased Abilify to 10 mg daily.    Discussed her concerns that she may have bipolar disorder  type II, most recent hypomania.  She has done some reading on the subject.  That appears to be accurate.  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  We discussed how the presence of SSRIs used for her OCD may increase the risk of rapid cycling.  However there is not a good alternative to SSRIs for OCD. Extensive discussion about alternative types of mood stabilizers including lithium, Depakote and atypical antipsychotics.  Lithium is not reliably effective for rapid cycling but father is bipolar with benefit from lithium.  We discussed the option of Depakote.  Because of "mouth tics" would like to avoid antipsychotics if possible. They resolved off the abilify.  No abnormal involuntary movements were observed in the office and she was examined. Aims score was 0  Needed the option of alternative mood stabilizer. She noticed an increase in suicidal thoughts a few days on the Trileptal so stopped it. Due to the suicidal thoughts lithium would be a good choice.  Her father  is also responded to lithium.  We will start a low dose since she is currently depressed and not manic in hopes of ensuring tolerability.  Informed her we could go up in the dose if she starts developing manic symptoms. We will use Lithobid because of nausea problems. Start Lithobid 300 mg nightly x 4 days then 600 mg nightly.  Counseled patient regarding potential benefits, risks, and side effects of lithium to include potential risk of lithium affecting thyroid and renal function.  Discussed need for periodic lab monitoring to determine drug level and to assess for potential adverse effects.  Counseled patient regarding signs and symptoms of lithium toxicity and advised that they notify office immediately or seek urgent medical attention if experiencing these signs and symptoms.  Patient advised to contact office with any questions or concerns.  Due to rapid cycling we have reduced the fluoxetine to 60 mg daily.   She has learned to swallow capsules while at the hospital.  We discussed the risk of worsening OCD at the lower dose  Coffee worsens tremor discussed and she's aware.  Asks for Zofran prn.  Okay she will let us know what pharmacy to send to.  Discussed the importance of avoiding marijuana and alcohol abuse at this time  We discussed her father's psychiatric meds and the potential for use of lamotrigine.  We will we will defer this choice at this time in order to keep medications as simple as possible  vitamin D 4000-5000 units daily in winter.  Disc counseling  Asks for accomodation for extended deadlines and copy of professor's notes.  Disc this. Asks for school accommodations, loser attendance policy, copies of notes, break up deadlines into smaller deadlines DT depressive ipisodes, difficulty with ADD and concentration on lectures,  ADD causing her to get overwhelmed with large projects.@ [employer] Return to school 8/19  Call if symptoms worsen or she has significant side effects.  FU 03/19/22  Lynder Parents, MD, DFAPA  Please see After Visit Summary for patient specific instructions.  Future Appointments  Date Time Provider [place]  03/19/2022 11:00 AM Cottle, Billey Co., MD [facility]-CP None      No orders of the defined types were placed in this encounter.    -------------------------------

## 2022-02-12 NOTE — Telephone Encounter (Signed)
Pt wants to increase zofran to 8 mg, 4 mg does not help much for nausea.She also stated that she is having trouble falling asleep.She has tried melatonin and it does not help.Once she is asleep she can sleep well,but just has trouble falling asleep.

## 2022-02-15 ENCOUNTER — Encounter: Payer: Self-pay | Admitting: Psychiatry

## 2022-02-17 ENCOUNTER — Telehealth: Payer: Self-pay | Admitting: Psychiatry

## 2022-02-17 ENCOUNTER — Other Ambulatory Visit: Payer: Self-pay

## 2022-02-17 DIAGNOSIS — F902 Attention-deficit hyperactivity disorder, combined type: Secondary | ICD-10-CM

## 2022-02-17 NOTE — Telephone Encounter (Signed)
Next visit is 03/19/22. Requesting refill on Vyvanse 30 mg called to:  [id number] [address] Phone:  [phone] Fax:  [phone]

## 2022-02-17 NOTE — Telephone Encounter (Signed)
Pended.

## 2022-02-18 MED ORDER — LISDEXAMFETAMINE DIMESYLATE 30 MG PO CAPS: 30.0000 mg | ORAL_CAPSULE | Freq: Every day | ORAL | 0 refills | Status: DC

## 2022-02-22 DIAGNOSIS — H66002 Acute suppurative otitis media without spontaneous rupture of ear drum, left ear: Secondary | ICD-10-CM | POA: Diagnosis not present

## 2022-02-22 DIAGNOSIS — H60392 Other infective otitis externa, left ear: Secondary | ICD-10-CM | POA: Diagnosis not present

## 2022-02-22 DIAGNOSIS — Z6822 Body mass index (BMI) 22.0-22.9, adult: Secondary | ICD-10-CM | POA: Diagnosis not present

## 2022-02-28 ENCOUNTER — Other Ambulatory Visit: Payer: Self-pay | Admitting: Psychiatry

## 2022-02-28 DIAGNOSIS — F422 Mixed obsessional thoughts and acts: Secondary | ICD-10-CM

## 2022-03-06 ENCOUNTER — Other Ambulatory Visit: Payer: Self-pay | Admitting: Psychiatry

## 2022-03-06 DIAGNOSIS — F319 Bipolar disorder, unspecified: Secondary | ICD-10-CM

## 2022-03-19 ENCOUNTER — Encounter: Payer: Self-pay | Admitting: Psychiatry

## 2022-03-19 ENCOUNTER — Telehealth (INDEPENDENT_AMBULATORY_CARE_PROVIDER_SITE_OTHER): Payer: BC Managed Care – PPO | Admitting: Psychiatry

## 2022-03-19 DIAGNOSIS — F902 Attention-deficit hyperactivity disorder, combined type: Secondary | ICD-10-CM

## 2022-03-19 DIAGNOSIS — F319 Bipolar disorder, unspecified: Secondary | ICD-10-CM

## 2022-03-19 DIAGNOSIS — F422 Mixed obsessional thoughts and acts: Secondary | ICD-10-CM

## 2022-03-19 DIAGNOSIS — F952 Tourette's disorder: Secondary | ICD-10-CM | POA: Diagnosis not present

## 2022-03-19 DIAGNOSIS — R198 Other specified symptoms and signs involving the digestive system and abdomen: Secondary | ICD-10-CM

## 2022-03-19 DIAGNOSIS — R110 Nausea: Secondary | ICD-10-CM

## 2022-03-19 MED ORDER — LISDEXAMFETAMINE DIMESYLATE 30 MG PO CAPS
30.0000 mg | ORAL_CAPSULE | Freq: Every day | ORAL | 0 refills | Status: DC
Start: 2022-03-19 — End: 2022-04-21

## 2022-03-19 MED ORDER — LITHIUM CARBONATE ER 300 MG PO TBCR: EXTENDED_RELEASE_TABLET | ORAL | 1 refills | Status: DC

## 2022-03-19 MED ORDER — FLUOXETINE HCL 20 MG PO CAPS: 40.0000 mg | ORAL_CAPSULE | Freq: Every day | ORAL | 0 refills | Status: DC

## 2022-03-19 MED ORDER — ONDANSETRON 8 MG PO TBDP: 8.0000 mg | ORAL_TABLET | Freq: Three times a day (TID) | ORAL | 0 refills | Status: DC | PRN

## 2022-03-19 NOTE — Progress Notes (Signed)
Kim [name] [id number] 2000/12/02 21 y.o.  Video Visit via My Chart  I connected with pt by video using My Chart and verified that I am speaking with the correct person using two identifiers.   I discussed the limitations, risks, security and privacy concerns of performing an evaluation and management service by My Chart  and the availability of in person appointments. I also discussed with the patient that there may be a patient responsible charge related to this service. The patient expressed understanding and agreed to proceed.  I discussed the assessment and treatment plan with the patient. The patient was provided an opportunity to ask questions and all were answered. The patient agreed with the plan and demonstrated an understanding of the instructions.   The patient was advised to call back or seek an in-person evaluation if the symptoms worsen or if the condition fails to improve as anticipated.  I provided 30 minutes of video time during this encounter.  The patient was located at home and the provider was located office. Session from noon until 1230 PM  Subjective:   Patient ID:  Kim [name] is a 21 y.o. (DOB [birth year]) female.  Chief Complaint:  Chief Complaint  Patient presents with   Follow-up   Depression   Manic Behavior    HPI Kim [name] presents today for follow-up of OCD, ADD, depression  02/20/20 appt with the following noted: Seen with mother Kim [name] and father Kim [name].  Rob needing psych help but gave up his appt for daughter to be seen before going to school. She's going to [place] to school. She has OCD, ADD, Tourette's and depression.  They wonder about other med changes. She needs liquids bc anxiety swallowing pills. Pt doesn't drive DT anxiety. Would like help with OCD.  Hesitant about ADD meds but poor tolerance with anxiety. Contamination fears with handwashing.  Germs in general and fear of vomtiing so hard to take pills. Handwashes time  hard to say but a couple of minutes.  Irritating time.   Triggers include cleaning.   She will avoid hospitals and in general doctors offices. Mo concerned OCD takes a lot of time and attention. She recognizes OCD started in middle school. Parents in support of her going to school Depressed but not severe.  Not a direct consequence of OCD.  Feels exhausted and not motivated. Sleep about 9 hours.  Energy OK.  Not much to concentrate on.  Hard to read. ADD lack of focus is a problem on reading and is a concern for school.  Had racing heart before stimulants and they made it worse. Can get this with anxiety and get dizzy. Leaves for school 8/31. [place] first year.  In [place].    03/07/20 appt with the following noted: Abilify 5 definitely helped with depression. Dep 3/10.   More active.  Better productivity.  Better mood. Some difference with OCD.  Not as big improvement as with depression but better.  Not sure about time spent with OCD.  Anxiety generally 5/10/  Worst times 8/10.   Biggest concern about OCD and school is the potential time involved.  Went time to do school work sometimes hard to resist OCD.  Not very concerned about OCD affecting her socially.   SE: Some hunger at times.  Restlessness fine now.   Plan: Continue Lexapro at current dose Increase Abilify to 7-8 mg now and if improved then no higher. If no improvement then increase to 10 mg  daily.   06/25/2020 appointment with the following noted: Mostly good but not much improvement with OCD.  Depression changing like usual.  Weather affects her and other factors.  History of seasonal depression.  More tired and sleep more.  Annoying but not a huge deal.   Increase in Abilify without sig change.   Finishes exams Thursday and break for a month. No SE.   Able to enjoy things. Handwashing 5 min each time she washes them.  Hard to know how much total time. Tremor before Lexapro.    Plan: Continue Lexapro but increase to 35  mg daily for 2 weeks, then 40 mg daily. High dose medically necessary.  Needs liquid and couldn't tolerate the sertraline. After exams drop Abilify back to 5 mg bc not better with increase to 10 mg.  08/12/2020 telephone call with the following noted: Kim [name] was taken off of Abilify and had her Lexapro increased. Since doing this she is feeling worse, more depressed and lack of energy. She would like to know what she needs to do to help with this MD response: Her last appointment was 06/25/2020.  Prior to that appointment we had increased Abilify from 5 mg to 10 mg without additional improvement.  Therefore the instructions were to reduce the Abilify back to 5 mg daily.  She was not instructed to discontinue it. Because the increase in Abilify had not been helpful from 5 to 10 mg, in addition to reducing the Abilify back to 5 mg she was to increase the Lexapro from 30 mg to 40 mg. It is unlikely that increasing Lexapro would make her more depressed.  I think the depression is worse because she stopped the Abilify apparently.  The increase in Lexapro could possibly make her more fatigued but worsening depression could be causing the fatigue.  The benefit from restarting Abilify 5 mg daily should be quick, that is benefit within a week. Therefore I suggest she continue the current dose of Lexapro and add 5 mg of Abilify back.  The depression should improve within a week or so.  If she is not better by February 10 then call us back  11/10/2020 appt noted: Stayed 1 semester at [employer] DT not a good fit and left.  Goiing to [employer] in the fall. Felt worse off the Abilify and restarted at 5 mg 08/12/20 and felt better energy. Doing pretty good, pretty busy.  Couple of classes at [employer] just to get some credit.s OCD is about the same without much change with increase in Lexapro.  No SE noted. Anxiety is fairly high usually associated with obsessive thoughts. Not too much depression. Sleep  regular. NO SE.   Not regular Zofran but occ and not sure what causes the nausea. Plan: Reduce Lexapro to 20 ml and add 5 ml fluoxetine for 1 week, Then reduce Lexapro to 10 ml and increase fluoxetine to 10 ml for 1 week, Then stop Lexapro and increase fluoxetine to 15 ml daily  02/16/2021 appointment with the following noted:  Seen with DAD Switched to fluoxetine 15 ml and energy and motivation and mood is better.  Anxiety is a little better but not b much.  OCD is about the same. No problems with the switch in meds.   Hard to tell how much time OCD takes bc she can mutifunciton while does compulsions. Avoids contaminants and handwashes but can work at [employer]. No SE.   Not really depressed.   Takes Zofran  intermittently a couple times per week can be triggered with caffeine. Father says she's seemed to be doing well and excited about school. Going to [employer] to [employer]. Less tremor. Plan: Option increase fluoxetine to 80 mg daily for OCD.  She'd like to try it.  07/01/2021 appointment with the following noted: Didn't increase bc fluoxetine dose is the same. 60 mg daily. Been doing fairly well except for when off for several weeks.  When on it I feel good. No SE Don't like the quillivant XR bc triggers intrusive SI and when coming off has depression and SI.  Not had this with other stimulants.  Takes it rarely. Still on aripiprazole 5 ml daily. No other SE. Mildly depressed.   Can have a good portion of time with OCD but doesn't always interfere with other things. Still nausea and not sure why.  Not caused by meds. When off Prozac grades went down. Goes back to school Jan 11.  01/04/2022 appointment with the following noted: Psychiatric hospitalization 11/06/2021 until 11/12/2021 at [facility] in [place] for depression with suicidal ideation after drinking alcohol with a bottle of cough syrup and use of marijuana as well as noncompliance with psychiatric  medications. Prescribed Abilify 5 mg daily along with fluoxetine 40 mg daily She's at home. She doesn't remember much about how she was doing then.  She had stopped meds a month or 2 before hosp but not sure why.  Before hosp was a few drinks 4 nights a week.  With drew from 1 class and passed the other 2.  Was smoking marijuana not daily but soemtimes multiple times per day.  Sometimes missed classes from oversleeping.  No opiates.   I think I might be bipolar bc was careening back and forth emotionally. Didn't sleep last night. Done some research on bipolar 2 including hyperactivity, with bursts of creativity and not sleeping. Also hypertalkative and hypersexual and risk taking and grandiose thinking.  Upswings might last days.  Mixed states also. Distinct depressions might last weeks.  Not much normal mood. Been at beach with friend last week.  No comments from family or friend. F thinks she might be bipolar. Has compulsions but not anxious about it. Handwashing a lot less than in the past. No panic and no avoidance. Had anxiety about classes bc wasn't prepared enough. Swallowing is better now. Nausea for years without pattern. Consistent with meds. Consistent with Abilify 5 and fluoxetine 80 Plan: She elects to increase Abilify to 10 mg daily.  01/22/22 appt noted: Increase Abilify 10 mg daily with SE mouth tics and reduced to 5 mg daily.  No one commented.  Moving around jaw would cause some pain soreness..  better on lower dose re: SE. Reduced just recently. Mood has been high and probably hypomanic or manic. Sometimes hears people knock on window or hears her name.  Weird. Staying up late and some EMA.  Avg 4-6 hours per night.  Some nights no sleep.   Mood and confidence and energy increase.  Always a bad spender, some increased risk taking behavior.  More talkative. Anxiety has been alright and not as bad as it was.   Nausea but no other sx physically. Plan: Trileptal and increase  to 150 mg every morning and 300 mg nightly.   Reduced fluxoeine to 60 DT rapid cycling Reduced Abilify to 5 mg daily. Continue Vyvanse 30  02/12/2022 appointment with the following noted: Not taking Trilepotal, stopped Trileptal DT increase SI so  stopped.  Took it a few days. Still having some SI.  Not all the time but not too strong bc no plan.  If sees knife will have the thought.   Off Abilify. Has been depressed since after vacation.  Low energy and mood, little motivation.  Neg self talk. No irritability or anger. Sleep too much 10+ hours.  No napping. Not doing much.   At the beach was feeling fine.   Main stress, boredom and loneliness. Planning to return to school and feels capable.  Benefit structure and friends. Not much anxiety or OCD. Mouth tics resolved. Persistent N and lost 10# bc hard to eat. Plan: Start Lithobid 300 mg nightly x 4 days then 600 mg nightly.  03/19/22 appt noted: Sharing apt. Good with room mates. Start Lithobid 300 mg nightly x 4 days then 600 mg nightly.  No effect noted. No SI Up and down with downs more noticeable.  Poor concept of time.  But days duration. With lows doesn't want to do anything. Only missed 2 classes.  Lays around when depressed. Sometimes can tell some mania and high energy and want to do so much stuff.Marland Kitchen  around the end of last week lasting 4-5 days. Not suire about mood now, can't identify how I'm feeling. Seeing out people and don't want to be alone.  Wouldn't say I'm not depressed. Not much anxiety. OCD not bad at all Rare trazodone. Worked with some hangover. nNausea is about the same. No cause known and has appt with doctor. Mouth tics much better off Abilify Sleep irregular from 4-12 hours.  Sometimes don't feel like sleeping.    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 F bipolar lithium with benefit.  Past Psychiatric Medication Trials: History of Tourette's, ADD, OCD History  of ADD brief trials Adderall XR 15, guanfacine ER 1 tired, methylphenidate, Strattera Quillevent SI No modafinil. Lexapro 40, sertraline liquid brief hard to swallow,   Fluoxetine 60 better mood mirtazapine, Wellbutrin Abilify 10 EPS & no better than 5 wheich helped. Trileptal brief increase SI  Review of Systems:  Review of Systems  Cardiovascular:  Negative for palpitations.  Gastrointestinal:  Positive for nausea.  Neurological:  Negative for dizziness and tremors.  Psychiatric/Behavioral:  Positive for sleep disturbance.     Medications: I have reviewed the patient's current medications.  Current Outpatient Medications  Medication Sig Dispense Refill   traZODone (DESYREL) 50 MG tablet Take 1 tablet (50 mg total) by mouth at bedtime as needed for sleep. 30 tablet 0   FLUoxetine (PROZAC) 20 MG capsule Take 2 capsules (40 mg total) by mouth daily. 180 capsule 0   lisdexamfetamine (VYVANSE) 30 MG capsule Take 1 capsule (30 mg total) by mouth daily. 30 capsule 0   lithium carbonate (LITHOBID) 300 MG CR tablet 1 at night for 4 nights then 2 at night 90 tablet 1   ondansetron (ZOFRAN-ODT) 8 MG disintegrating tablet Take 1 tablet (8 mg total) by mouth every 8 (eight) hours as needed for nausea or vomiting. 20 tablet 0   No current facility-administered medications for this visit.    Medication Side Effects: None  Allergies: No Known Allergies  Past Medical History:  Diagnosis Date   Constipation    GERD (gastroesophageal reflux disease)    Granuloma of skin    on forehead    Family History  Problem Relation Age of Onset   GER disease Father    Ulcerative colitis Father  ADD / ADHD Father    Anxiety disorder Father    Depression Father    Bipolar disorder Father    Migraines Paternal Grandmother    Anxiety disorder Paternal Grandmother    Depression Paternal Grandmother    Seizures Neg Hx    Autism Neg Hx    Schizophrenia Neg Hx     Social History    Socioeconomic History   Marital status: Single    Spouse name: n/a   Number of children: 0   Years of education: Not on file   Highest education level: Not on file  Occupational History   Occupation: student  Tobacco Use   Smoking status: Never   Smokeless tobacco: Never  Vaping Use   Vaping Use: Never used  Substance and Sexual Activity   Alcohol use: No   Drug use: No   Sexual activity: Never  Other Topics Concern   Not on file  Social History Narrative   Lives with mom, dad, brother in the same house. She is in the 11th grade at [employer]. Does well in school. She enjoys listening to music, writing and singing.    Social Determinants of Health   Financial Resource Strain: Not on file  Food Insecurity: Not on file  Transportation Needs: Not on file  Physical Activity: Not on file  Stress: Not on file  Social Connections: Not on file  Intimate Partner Violence: Not on file    Past Medical History, Surgical history, Social history, and Family history were reviewed and updated as appropriate.   Please see review of systems for further details on the patient's review from today.   Objective:   Physical Exam:  There were no vitals taken for this visit.  Physical Exam Musculoskeletal:        General: No deformity.  Neurological:     Mental Status: She is alert and oriented to person, place, and time.     Coordination: Coordination normal.  Psychiatric:        Attention and Perception: Attention and perception normal. She does not perceive auditory or visual hallucinations.        Mood and Affect: Mood is depressed. Mood is not anxious. Affect is not labile, blunt, angry or inappropriate.        Speech: Speech normal.        Behavior: Behavior normal. Behavior is cooperative.        Thought Content: Thought content is not paranoid or delusional. Thought content does not include homicidal or suicidal ideation. Thought content does not include suicidal plan.         Cognition and Memory: Cognition and memory normal.        Judgment: Judgment normal.     Comments: Insight intact OCD minimal Anxiety not much  SI resolved with lithium but still mood cycling with mild -moderate sx alexithymia     Lab Review:  No results found for: "NA", "K", "CL", "CO2", "GLUCOSE", "BUN", "CREATININE", "CALCIUM", "PROT", "ALBUMIN", "AST", "ALT", "ALKPHOS", "BILITOT", "GFRNONAA", "GFRAA"  No results found for: "WBC", "RBC", "HGB", "HCT", "PLT", "MCV", "MCH", "MCHC", "RDW", "LYMPHSABS", "MONOABS", "EOSABS", "BASOSABS"  No results found for: "POCLITH", "LITHIUM"   No results found for: "PHENYTOIN", "PHENOBARB", "VALPROATE", "CBMZ"   Review [facility] testing dated September 30, 2017.  SLC 6 A4 was long long.  HDR2A was normal genotype. Pharmacokinetic genes included normal 2D6, 3 A4, 2 C9, 2C19, 1A2, and intermediate 2B 6  .res Assessment: Plan:    Kim [name] was seen  today for follow-up, depression and manic behavior.  Diagnoses and all orders for this visit:  Bipolar affective disorder, rapid cycling (HCC) -     Lithium level -     lithium carbonate (LITHOBID) 300 MG CR tablet; 1 at night for 4 nights then 2 at night  Difficulty swallowing pills -     ondansetron (ZOFRAN-ODT) 8 MG disintegrating tablet; Take 1 tablet (8 mg total) by mouth every 8 (eight) hours as needed for nausea or vomiting.  Chronic nausea -     ondansetron (ZOFRAN-ODT) 8 MG disintegrating tablet; Take 1 tablet (8 mg total) by mouth every 8 (eight) hours as needed for nausea or vomiting.  Mixed obsessional thoughts and acts -     FLUoxetine (PROZAC) 20 MG capsule; Take 2 capsules (40 mg total) by mouth daily.  Attention deficit hyperactivity disorder (ADHD), combined type, moderate -     lisdexamfetamine (VYVANSE) 30 MG capsule; Take 1 capsule (30 mg total) by mouth daily.  Tourette's disorder   Greater than 50% of 30 min face to face time with patient was spent on counseling and  coordination of care.  Extended time because required because of recent hospitalization and additional psychiatric diagnosis.  Psychiatric hospitalization 11/06/2021 until 11/12/2021 at [facility] in [place] for depression with suicidal ideation after drinking alcohol with a bottle of cough syrup and use of marijuana as well as noncompliance with psychiatric medications. Prescribed Abilify 5 mg daily along with fluoxetine 40 mg daily.Had EPS with incrased Abilify to 10 mg daily.   Stopped DT "mouth tics".  Discussed her concerns that she may have bipolar disorder type II, recent hypomania.  She has done some reading on the subject.  That appears to be accurate.  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  We discussed how the presence of SSRIs used for her OCD may increase the risk of rapid cycling.  However there is not a good alternative to SSRIs for OCD. Extensive discussion about alternative types of mood stabilizers including lithium, Depakote and atypical antipsychotics.  Lithium is not reliably effective for rapid cycling but father is bipolar with benefit from lithium.  We discussed the option of Depakote but not ideal in woman of child bearing age but we may have little other options DT apprent SE antipsychotic.  Because of "mouth tics" would like to avoid antipsychotics if possible. They nealry resolved off the abilify.  No abnormal involuntary movements were observed in the video and  . Needed the option of alternative mood stabilizer. She noticed an increase in suicidal thoughts a few days on the Trileptal so stopped it. Due to the suicidal thoughts lithium would be a good choice.  Her father is also responded to lithium.  We will start a low dose since she is currently depressed and not manic in hopes of ensuring tolerability.  Informed her we could go up in the dose if she starts developing manic symptoms. We will use Lithobid because of nausea problems. Start Lithobid 900  mg nightly. Check lithium level.  Counseled patient regarding potential benefits, risks, and side effects of lithium to include potential risk of lithium affecting thyroid and renal function.  Discussed need for periodic lab monitoring to determine drug level and to assess for potential adverse effects.  Counseled patient regarding signs and symptoms of lithium toxicity and advised that they notify office immediately or seek urgent medical attention if experiencing these signs and symptoms.  Patient advised to contact office with  any questions or concerns.  Due to rapid cycling we have reduced the fluoxetine to 40 mg daily. Took 60 for 7 weeks.    She has learned to swallow capsules while at the hospital.  We discussed the risk of worsening OCD at the lower dose  Coffee worsens tremor discussed and she's aware.  Asks for Zofran prn.  Okay she will let us know what pharmacy to send to.  Discussed the importance of avoiding marijuana and alcohol abuse at this time  We discussed her father's psychiatric meds and the potential for use of lamotrigine.  We will we will defer this choice at this time in order to keep medications as simple as possible  vitamin D 4000-5000 units daily in winter.  Disc counseling  Asked for accomodation for extended deadlines and copy of professor's notes.  Disc this. Asks for school accommodations, loser attendance policy, copies of notes, break up deadlines into smaller deadlines DT depressive ipisodes, difficulty with ADD and concentration on lectures,  ADD causing her to get overwhelmed with large projects.@ [employer] Return to school 8/19  Call if symptoms worsen or she has significant side effects.  FU 03/19/22  Lynder Parents, MD, DFAPA  Please see After Visit Summary for patient specific instructions.  No future appointments.     Orders Placed This Encounter  Procedures   Lithium level     -------------------------------

## 2022-03-24 ENCOUNTER — Telehealth: Payer: Self-pay | Admitting: Psychiatry

## 2022-04-07 DIAGNOSIS — R110 Nausea: Secondary | ICD-10-CM | POA: Diagnosis not present

## 2022-04-07 DIAGNOSIS — R194 Change in bowel habit: Secondary | ICD-10-CM | POA: Diagnosis not present

## 2022-04-07 DIAGNOSIS — R6881 Early satiety: Secondary | ICD-10-CM | POA: Diagnosis not present

## 2022-04-12 DIAGNOSIS — R6881 Early satiety: Secondary | ICD-10-CM | POA: Diagnosis not present

## 2022-04-12 DIAGNOSIS — R110 Nausea: Secondary | ICD-10-CM | POA: Diagnosis not present

## 2022-04-21 ENCOUNTER — Telehealth (INDEPENDENT_AMBULATORY_CARE_PROVIDER_SITE_OTHER): Payer: BC Managed Care – PPO | Admitting: Psychiatry

## 2022-04-21 ENCOUNTER — Telehealth: Payer: Self-pay | Admitting: Psychiatry

## 2022-04-21 ENCOUNTER — Encounter: Payer: Self-pay | Admitting: Psychiatry

## 2022-04-21 DIAGNOSIS — F952 Tourette's disorder: Secondary | ICD-10-CM

## 2022-04-21 DIAGNOSIS — F422 Mixed obsessional thoughts and acts: Secondary | ICD-10-CM | POA: Diagnosis not present

## 2022-04-21 DIAGNOSIS — F319 Bipolar disorder, unspecified: Secondary | ICD-10-CM

## 2022-04-21 DIAGNOSIS — F902 Attention-deficit hyperactivity disorder, combined type: Secondary | ICD-10-CM | POA: Diagnosis not present

## 2022-04-21 DIAGNOSIS — G901 Familial dysautonomia [Riley-Day]: Secondary | ICD-10-CM

## 2022-04-21 MED ORDER — MODAFINIL 100 MG PO TABS: ORAL_TABLET | ORAL | 1 refills | Status: DC

## 2022-04-21 NOTE — Addendum Note (Signed)
Addended by: Reatha Armour on: 04/21/2022 05:22 PM   Modules accepted: Orders

## 2022-04-21 NOTE — Telephone Encounter (Signed)
Not sure which med you discussed

## 2022-04-21 NOTE — Progress Notes (Addendum)
Kim [name] [id number] [birth year] 21 y.o.  Video Visit via My Chart  I connected with pt by video using My Chart and verified that I am speaking with the correct person using two identifiers.   I discussed the limitations, risks, security and privacy concerns of performing an evaluation and management service by My Chart  and the availability of in person appointments. I also discussed with the patient that there may be a patient responsible charge related to this service. The patient expressed understanding and agreed to proceed.  I discussed the assessment and treatment plan with the patient. The patient was provided an opportunity to ask questions and all were answered. The patient agreed with the plan and demonstrated an understanding of the instructions.   The patient was advised to call back or seek an in-person evaluation if the symptoms worsen or if the condition fails to improve as anticipated.  I provided 30 minutes of video time during this encounter.  The patient was located at home and the provider was located office. Session from 11 15-11 45 AM Subjective:   Patient ID:  Kim [name] is a 21 y.o. (DOB May 15, 2001) female.  Chief Complaint:  Chief Complaint  Patient presents with   Follow-up    Bipolar affective disorder, rapid cycling [facility])   ADHD   Depression   Anxiety    HPI Kim [name] presents today for follow-up of OCD, ADD, depression  02/20/20 appt with the following noted: Seen with mother Kim [name] and father Kim [name].  Rob needing psych help but gave up his appt for daughter to be seen before going to school. She's going to [place] to school. She has OCD, ADD, Tourette's and depression.  They wonder about other med changes. She needs liquids bc anxiety swallowing pills. Pt doesn't drive DT anxiety. Would like help with OCD.  Hesitant about ADD meds but poor tolerance with anxiety. Contamination fears with handwashing.  Germs in general and fear of  vomtiing so hard to take pills. Handwashes time hard to say but a couple of minutes.  Irritating time.   Triggers include cleaning.   She will avoid hospitals and in general doctors offices. Mo concerned OCD takes a lot of time and attention. She recognizes OCD started in middle school. Parents in support of her going to school Depressed but not severe.  Not a direct consequence of OCD.  Feels exhausted and not motivated. Sleep about 9 hours.  Energy OK.  Not much to concentrate on.  Hard to read. ADD lack of focus is a problem on reading and is a concern for school.  Had racing heart before stimulants and they made it worse. Can get this with anxiety and get dizzy. Leaves for school 8/31. [employer] first year.  In [place].    03/07/20 appt with the following noted: Abilify 5 definitely helped with depression. Dep 3/10.   More active.  Better productivity.  Better mood. Some difference with OCD.  Not as big improvement as with depression but better.  Not sure about time spent with OCD.  Anxiety generally 5/10/  Worst times 8/10.   Biggest concern about OCD and school is the potential time involved.  Went time to do school work sometimes hard to resist OCD.  Not very concerned about OCD affecting her socially.   SE: Some hunger at times.  Restlessness fine now.   Plan: Continue Lexapro at current dose Increase Abilify to 7-8 mg now and if improved then  no higher. If no improvement then increase to 10 mg daily.   06/25/2020 appointment with the following noted: Mostly good but not much improvement with OCD.  Depression changing like usual.  Weather affects her and other factors.  History of seasonal depression.  More tired and sleep more.  Annoying but not a huge deal.   Increase in Abilify without sig change.   Finishes exams Thursday and break for a month. No SE.   Able to enjoy things. Handwashing 5 min each time she washes them.  Hard to know how much total time. Tremor before  Lexapro.    Plan: Continue Lexapro but increase to 35 mg daily for 2 weeks, then 40 mg daily. High dose medically necessary.  Needs liquid and couldn't tolerate the sertraline. After exams drop Abilify back to 5 mg bc not better with increase to 10 mg.  08/12/2020 telephone call with the following noted: Kim [name] was taken off of Abilify and had her Lexapro increased. Since doing this she is feeling worse, more depressed and lack of energy. She would like to know what she needs to do to help with this MD response: Her last appointment was 06/25/2020.  Prior to that appointment we had increased Abilify from 5 mg to 10 mg without additional improvement.  Therefore the instructions were to reduce the Abilify back to 5 mg daily.  She was not instructed to discontinue it. Because the increase in Abilify had not been helpful from 5 to 10 mg, in addition to reducing the Abilify back to 5 mg she was to increase the Lexapro from 30 mg to 40 mg. It is unlikely that increasing Lexapro would make her more depressed.  I think the depression is worse because she stopped the Abilify apparently.  The increase in Lexapro could possibly make her more fatigued but worsening depression could be causing the fatigue.  The benefit from restarting Abilify 5 mg daily should be quick, that is benefit within a week. Therefore I suggest she continue the current dose of Lexapro and add 5 mg of Abilify back.  The depression should improve within a week or so.  If she is not better by February 10 then call us back  11/10/2020 appt noted: Stayed 1 semester at [employer] DT not a good fit and left.  Goiing to [employer] in the fall. Felt worse off the Abilify and restarted at 5 mg 08/12/20 and felt better energy. Doing pretty good, pretty busy.  Couple of classes at [employer] just to get some credit.s OCD is about the same without much change with increase in Lexapro.  No SE noted. Anxiety is fairly high usually associated with  obsessive thoughts. Not too much depression. Sleep regular. NO SE.   Not regular Zofran but occ and not sure what causes the nausea. Plan: Reduce Lexapro to 20 ml and add 5 ml fluoxetine for 1 week, Then reduce Lexapro to 10 ml and increase fluoxetine to 10 ml for 1 week, Then stop Lexapro and increase fluoxetine to 15 ml daily  02/16/2021 appointment with the following noted:  Seen with DAD Switched to fluoxetine 15 ml and energy and motivation and mood is better.  Anxiety is a little better but not b much.  OCD is about the same. No problems with the switch in meds.   Hard to tell how much time OCD takes bc she can mutifunciton while does compulsions. Avoids contaminants and handwashes but can work at [employer]. No  SE.   Not really depressed.   Takes Zofran intermittently a couple times per week can be triggered with caffeine. Father says she's seemed to be doing well and excited about school. Going to [employer] to [employer]. Less tremor. Plan: Option increase fluoxetine to 80 mg daily for OCD.  She'd like to try it.  07/01/2021 appointment with the following noted: Didn't increase bc fluoxetine dose is the same. 60 mg daily. Been doing fairly well except for when off for several weeks.  When on it I feel good. No SE Don't like the quillivant XR bc triggers intrusive SI and when coming off has depression and SI.  Not had this with other stimulants.  Takes it rarely. Still on aripiprazole 5 ml daily. No other SE. Mildly depressed.   Can have a good portion of time with OCD but doesn't always interfere with other things. Still nausea and not sure why.  Not caused by meds. When off Prozac grades went down. Goes back to school Jan 11.  01/04/2022 appointment with the following noted: Psychiatric hospitalization 11/06/2021 until 11/12/2021 at [facility] in [place] for depression with suicidal ideation after drinking alcohol with a bottle of cough syrup and use of  marijuana as well as noncompliance with psychiatric medications. Prescribed Abilify 5 mg daily along with fluoxetine 40 mg daily She's at home. She doesn't remember much about how she was doing then.  She had stopped meds a month or 2 before hosp but not sure why.  Before hosp was a few drinks 4 nights a week.  With drew from 1 class and passed the other 2.  Was smoking marijuana not daily but soemtimes multiple times per day.  Sometimes missed classes from oversleeping.  No opiates.   I think I might be bipolar bc was careening back and forth emotionally. Didn't sleep last night. Done some research on bipolar 2 including hyperactivity, with bursts of creativity and not sleeping. Also hypertalkative and hypersexual and risk taking and grandiose thinking.  Upswings might last days.  Mixed states also. Distinct depressions might last weeks.  Not much normal mood. Been at beach with friend last week.  No comments from family or friend. F thinks she might be bipolar. Has compulsions but not anxious about it. Handwashing a lot less than in the past. No panic and no avoidance. Had anxiety about classes bc wasn't prepared enough. Swallowing is better now. Nausea for years without pattern. Consistent with meds. Consistent with Abilify 5 and fluoxetine 80 Plan: She elects to increase Abilify to 10 mg daily.  01/22/22 appt noted: Increase Abilify 10 mg daily with SE mouth tics and reduced to 5 mg daily.  No one commented.  Moving around jaw would cause some pain soreness..  better on lower dose re: SE. Reduced just recently. Mood has been high and probably hypomanic or manic. Sometimes hears people knock on window or hears her name.  Weird. Staying up late and some EMA.  Avg 4-6 hours per night.  Some nights no sleep.   Mood and confidence and energy increase.  Always a bad spender, some increased risk taking behavior.  More talkative. Anxiety has been alright and not as bad as it was.   Nausea but no  other sx physically. Plan: Trileptal and increase to 150 mg every morning and 300 mg nightly.   Reduced fluxoeine to 60 DT rapid cycling Reduced Abilify to 5 mg daily. Continue Vyvanse 30  02/12/2022 appointment with the following  noted: Not taking Trilepotal, stopped Trileptal DT increase SI so stopped.  Took it a few days. Still having some SI.  Not all the time but not too strong bc no plan.  If sees knife will have the thought.   Off Abilify. Has been depressed since after vacation.  Low energy and mood, little motivation.  Neg self talk. No irritability or anger. Sleep too much 10+ hours.  No napping. Not doing much.   At the beach was feeling fine.   Main stress, boredom and loneliness. Planning to return to school and feels capable.  Benefit structure and friends. Not much anxiety or OCD. Mouth tics resolved. Persistent N and lost 10# bc hard to eat. Plan: Start Lithobid 300 mg nightly x 4 days then 600 mg nightly.  03/19/22 appt noted: Sharing apt. Good with room mates. Start Lithobid 300 mg nightly x 4 days then 600 mg nightly.  No effect noted. No SI Up and down with downs more noticeable.  Poor concept of time.  But days duration. With lows doesn't want to do anything. Only missed 2 classes.  Lays around when depressed. Sometimes can tell some mania and high energy and want to do so much stuff.Marland Kitchen  around the end of last week lasting 4-5 days. Not suire about mood now, can't identify how I'm feeling. Seeing out people and don't want to be alone.  Wouldn't say I'm not depressed. Not much anxiety. OCD not bad at all Rare trazodone. Worked with some hangover. nNausea is about the same. No cause known and has appt with doctor. Mouth tics much better off Abilify Sleep irregular from 4-12 hours.  Sometimes don't feel like sleeping.    04/21/22 appt noted: Taking lithium 600 mg daily. Missed Vyvanse and N is better.  Did help with ADD. Sleep is better but more than she would  like. Willing to try increasing the lithium. She wants help with ADD but has not tolerated stimulants very well.  She is keeping up with class work for the most part and is not missing classes generally.  She needs to sleep more than she would like about 10 hours.  No major mood swings.  No suicidal thoughts since she was last here.  Anxiety is manageable but she does feel somewhat stressed by her schoolwork.  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 F bipolar lithium with benefit.  Past Psychiatric Medication Trials: History of Tourette's, ADD, OCD History of ADD brief trials Adderall XR 15, guanfacine ER 1 tired, methylphenidate, Strattera Quillevent SI Vyvanse N No modafinil. Lexapro 40, sertraline liquid brief hard to swallow,   Fluoxetine 60 better mood mirtazapine, Wellbutrin Abilify 10 EPS & no better than 5 wheich helped. Trileptal brief increase SI  Review of Systems:  Review of Systems  Cardiovascular:  Negative for palpitations.  Gastrointestinal:  Positive for nausea.  Neurological:  Negative for dizziness and tremors.  Psychiatric/Behavioral:  Negative for sleep disturbance.     Medications: I have reviewed the patient's current medications.  Current Outpatient Medications  Medication Sig Dispense Refill   FLUoxetine (PROZAC) 20 MG capsule Take 2 capsules (40 mg total) by mouth daily. 180 capsule 0   lithium carbonate (LITHOBID) 300 MG CR tablet 1 at night for 4 nights then 2 at night (Patient taking differently: Take 600 mg by mouth daily at 12 noon.) 90 tablet 1   ondansetron (ZOFRAN-ODT) 8 MG disintegrating tablet Take 1 tablet (8 mg total) by  mouth every 8 (eight) hours as needed for nausea or vomiting. 20 tablet 0   traZODone (DESYREL) 50 MG tablet Take 1 tablet (50 mg total) by mouth at bedtime as needed for sleep. 30 tablet 0   No current facility-administered medications for this visit.    Medication Side  Effects: None  Allergies: No Known Allergies  Past Medical History:  Diagnosis Date   Constipation    GERD (gastroesophageal reflux disease)    Granuloma of skin    on forehead    Family History  Problem Relation Age of Onset   GER disease Father    Ulcerative colitis Father    ADD / ADHD Father    Anxiety disorder Father    Depression Father    Bipolar disorder Father    Migraines Paternal Grandmother    Anxiety disorder Paternal Grandmother    Depression Paternal Grandmother    Seizures Neg Hx    Autism Neg Hx    Schizophrenia Neg Hx     Social History   Socioeconomic History   Marital status: Single    Spouse name: n/a   Number of children: 0   Years of education: Not on file   Highest education level: Not on file  Occupational History   Occupation: student  Tobacco Use   Smoking status: Never   Smokeless tobacco: Never  Vaping Use   Vaping Use: Never used  Substance and Sexual Activity   Alcohol use: No   Drug use: No   Sexual activity: Never  Other Topics Concern   Not on file  Social History Narrative   Lives with mom, dad, brother in the same house. She is in the 11th grade at [employer]. Does well in school. She enjoys listening to music, writing and singing.    Social Determinants of Health   Financial Resource Strain: Not on file  Food Insecurity: Not on file  Transportation Needs: Not on file  Physical Activity: Not on file  Stress: Not on file  Social Connections: Not on file  Intimate Partner Violence: Not on file    Past Medical History, Surgical history, Social history, and Family history were reviewed and updated as appropriate.   Please see review of systems for further details on the patient's review from today.   Objective:   Physical Exam:  There were no vitals taken for this visit.  Physical Exam Neurological:     Mental Status: She is alert and oriented to person, place, and time.     Cranial Nerves: No dysarthria.   Psychiatric:        Attention and Perception: Attention and perception normal.        Mood and Affect: Mood is anxious.        Speech: Speech normal.        Behavior: Behavior is cooperative.        Thought Content: Thought content normal. Thought content is not paranoid or delusional. Thought content does not include homicidal or suicidal ideation. Thought content does not include suicidal plan.        Cognition and Memory: Cognition and memory normal.        Judgment: Judgment normal.     Comments: Insight intact Anxiety manageable  Depression ok     Lab Review:  No results found for: "NA", "K", "CL", "CO2", "GLUCOSE", "BUN", "CREATININE", "CALCIUM", "PROT", "ALBUMIN", "AST", "ALT", "ALKPHOS", "BILITOT", "GFRNONAA", "GFRAA"  No results found for: "WBC", "RBC", "HGB", "HCT", "PLT", "MCV", "MCH", "MCHC", "  RDW", "LYMPHSABS", "MONOABS", "EOSABS", "BASOSABS"  No results found for: "POCLITH", "LITHIUM"   No results found for: "PHENYTOIN", "PHENOBARB", "VALPROATE", "CBMZ"   Review Gene[employer] testing dated September 30, 2017.  SLC 6 A4 was long long.  HDR2A was normal genotype. Pharmacokinetic genes included normal 2D6, 3 A4, 2 C9, 2C19, 1A2, and intermediate 2B 6  .res Assessment: Plan:    Zekiah was seen today for follow-up, adhd, depression and anxiety.  Diagnoses and all orders for this visit:  Bipolar affective disorder, rapid cycling ([place])  Mixed obsessional thoughts and acts  Attention deficit hyperactivity disorder (ADHD), combined type, moderate  Tourette's disorder  Dysautonomia ([place])   Greater than 50% of 30 min face to face time with patient was spent on counseling and coordination of care.  Extended time because required because of recent hospitalization and additional psychiatric diagnosis.  Psychiatric hospitalization 11/06/2021 until 11/12/2021 at [facility] in [place] for depression with suicidal ideation after drinking alcohol with a bottle of cough syrup and use of  marijuana as well as noncompliance with psychiatric medications. Prescribed Abilify 5 mg daily along with fluoxetine 40 mg daily.Had EPS with incrased Abilify to 10 mg daily.   Stopped DT "mouth tics".  Discussed her concerns that she may have bipolar disorder type II, recent hypomania.  She has done some reading on the subject.  That appears to be accurate.  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  We discussed how the presence of SSRIs used for her OCD may increase the risk of rapid cycling.  However there is not a good alternative to SSRIs for OCD. Extensive discussion about alternative types of mood stabilizers including lithium, Depakote and atypical antipsychotics.  Lithium is not reliably effective for rapid cycling but father is bipolar with benefit from lithium.  We discussed the option of Depakote but not ideal in woman of child bearing age but we may have little other options DT apprent SE antipsychotic.  Because of "mouth tics" would like to avoid antipsychotics if possible. They nealry resolved off the abilify.  No abnormal involuntary movements were observed in the video and  . Needed the option of alternative mood stabilizer. She noticed an increase in suicidal thoughts a few days on the Trileptal so stopped it. Due to the suicidal thoughts lithium would be a good choice.  Her father is also responded to lithium.  We will start a low dose since she is currently depressed and not manic in hopes of ensuring tolerability.  Informed her we could go up in the dose if she starts developing manic symptoms.  We discussed again the importance of the mood stabilizer.  She has never increased the lithium above 600 mg daily.  She may get additional benefit higher dose and given the severity of previous symptoms would recommend she try it.  She can always reduce the dose if she feels like she does not tolerate it well or does not see additional benefit.  She agrees to do this. We  will use Lithobid because of nausea problems. Still rec increase  Lithobid 900 mg nightly. Check lithium level.  Counseled patient regarding potential benefits, risks, and side effects of lithium to include potential risk of lithium affecting thyroid and renal function.  Discussed need for periodic lab monitoring to determine drug level and to assess for potential adverse effects.  Counseled patient regarding signs and symptoms of lithium toxicity and advised that they notify office immediately or seek urgent medical attention if  experiencing these signs and symptoms.  Patient advised to contact office with any questions or concerns.  Due to rapid cycling we have reduced the fluoxetine to 40 mg daily. Took 60 for 7 weeks.    She has learned to swallow capsules while at the hospital.  We discussed the risk of worsening OCD at the lower dose  Coffee worsens tremor discussed and she's aware.  Modafinil off label DT poor tolerance stimulants, modafinil 50-100 mg AM.  Off label for ADD bc N with Vyvanse and poor mood tolerance.   Discussed the importance of avoiding marijuana and alcohol abuse at this time  We discussed her father's psychiatric meds and the potential for use of lamotrigine.  We will we will defer this choice at this time in order to keep medications as simple as possible  vitamin D 4000-5000 units daily in winter.  Disc counseling  Asked for accomodation for extended deadlines and copy of professor's notes.  Disc this. Asks for school accommodations, loser attendance policy, copies of notes, break up deadlines into smaller deadlines DT depressive ipisodes, difficulty with ADD and concentration on lectures,  ADD causing her to get overwhelmed with large projects.@ [place]  school 8/19  Call if symptoms worsen or she has significant side effects.  FU 2 mos  Lynder Parents, MD, DFAPA  Please see After Visit Summary for patient specific instructions.  No future  appointments.     No orders of the defined types were placed in this encounter.    -------------------------------

## 2022-04-21 NOTE — Telephone Encounter (Signed)
Sent modafinil

## 2022-04-21 NOTE — Telephone Encounter (Signed)
Pt lvm that she would like the new medicine sent into the [facility] on [address]

## 2022-04-30 ENCOUNTER — Other Ambulatory Visit: Payer: Self-pay | Admitting: Psychiatry

## 2022-04-30 DIAGNOSIS — F422 Mixed obsessional thoughts and acts: Secondary | ICD-10-CM

## 2022-05-07 ENCOUNTER — Other Ambulatory Visit: Payer: Self-pay | Admitting: Psychiatry

## 2022-05-07 DIAGNOSIS — F319 Bipolar disorder, unspecified: Secondary | ICD-10-CM

## 2022-06-23 NOTE — Telephone Encounter (Signed)
Error

## 2022-07-29 ENCOUNTER — Other Ambulatory Visit: Payer: Self-pay | Admitting: Psychiatry

## 2022-07-29 DIAGNOSIS — F422 Mixed obsessional thoughts and acts: Secondary | ICD-10-CM

## 2022-07-29 DIAGNOSIS — F319 Bipolar disorder, unspecified: Secondary | ICD-10-CM

## 2022-07-29 NOTE — Telephone Encounter (Signed)
Please schedule appt

## 2022-07-30 ENCOUNTER — Other Ambulatory Visit: Payer: Self-pay | Admitting: Psychiatry

## 2022-07-30 DIAGNOSIS — F319 Bipolar disorder, unspecified: Secondary | ICD-10-CM

## 2022-07-30 NOTE — Telephone Encounter (Signed)
Please schedule appt

## 2022-08-02 ENCOUNTER — Other Ambulatory Visit: Payer: Self-pay

## 2022-08-02 ENCOUNTER — Telehealth: Payer: Self-pay | Admitting: Psychiatry

## 2022-08-02 DIAGNOSIS — F319 Bipolar disorder, unspecified: Secondary | ICD-10-CM

## 2022-08-02 DIAGNOSIS — F422 Mixed obsessional thoughts and acts: Secondary | ICD-10-CM

## 2022-08-02 MED ORDER — FLUOXETINE HCL 20 MG PO CAPS: 40.0000 mg | ORAL_CAPSULE | Freq: Every day | ORAL | 0 refills | Status: DC

## 2022-08-02 MED ORDER — LITHIUM CARBONATE ER 300 MG PO TBCR: 600.0000 mg | EXTENDED_RELEASE_TABLET | Freq: Every day | ORAL | 0 refills | Status: DC

## 2022-08-02 MED ORDER — LITHIUM CARBONATE ER 300 MG PO TBCR: 900.0000 mg | EXTENDED_RELEASE_TABLET | Freq: Every day | ORAL | 0 refills | Status: DC

## 2022-08-02 NOTE — Telephone Encounter (Signed)
Pt is scheduled for 09/22/22

## 2022-08-02 NOTE — Telephone Encounter (Signed)
Pt called at 3:15pm. She has picked up the lithium Rx at the pharmacy and is questioning the dosage on it. She picked up '600mg'$  2 a day, but feels that she is supposed to take 3 per day. Can we review the notes and confirm with her what she is to take?

## 2022-08-02 NOTE — Telephone Encounter (Signed)
Spoke to pt

## 2022-08-02 NOTE — Telephone Encounter (Signed)
Pt is scheduled 3/13

## 2022-08-11 DIAGNOSIS — Z803 Family history of malignant neoplasm of breast: Secondary | ICD-10-CM | POA: Diagnosis not present

## 2022-08-11 DIAGNOSIS — L659 Nonscarring hair loss, unspecified: Secondary | ICD-10-CM | POA: Diagnosis not present

## 2022-08-11 DIAGNOSIS — Z23 Encounter for immunization: Secondary | ICD-10-CM | POA: Diagnosis not present

## 2022-08-11 DIAGNOSIS — F3281 Premenstrual dysphoric disorder: Secondary | ICD-10-CM | POA: Diagnosis not present

## 2022-08-31 ENCOUNTER — Telehealth: Payer: Self-pay | Admitting: Psychiatry

## 2022-08-31 NOTE — Telephone Encounter (Signed)
Has a RF available. LVM to RC.

## 2022-08-31 NOTE — Telephone Encounter (Signed)
Pt called and needs a refill on her modafinil 100 mg tablet. [facility] [address]

## 2022-09-22 ENCOUNTER — Telehealth (INDEPENDENT_AMBULATORY_CARE_PROVIDER_SITE_OTHER): Payer: BC Managed Care – PPO | Admitting: Psychiatry

## 2022-09-22 ENCOUNTER — Encounter: Payer: Self-pay | Admitting: Psychiatry

## 2022-09-22 DIAGNOSIS — F319 Bipolar disorder, unspecified: Secondary | ICD-10-CM | POA: Diagnosis not present

## 2022-09-22 DIAGNOSIS — R110 Nausea: Secondary | ICD-10-CM

## 2022-09-22 DIAGNOSIS — G901 Familial dysautonomia [Riley-Day]: Secondary | ICD-10-CM

## 2022-09-22 DIAGNOSIS — F422 Mixed obsessional thoughts and acts: Secondary | ICD-10-CM | POA: Diagnosis not present

## 2022-09-22 DIAGNOSIS — F952 Tourette's disorder: Secondary | ICD-10-CM | POA: Diagnosis not present

## 2022-09-22 DIAGNOSIS — F902 Attention-deficit hyperactivity disorder, combined type: Secondary | ICD-10-CM | POA: Diagnosis not present

## 2022-09-22 DIAGNOSIS — R198 Other specified symptoms and signs involving the digestive system and abdomen: Secondary | ICD-10-CM

## 2022-09-22 MED ORDER — LITHIUM CARBONATE ER 300 MG PO TBCR: 900.0000 mg | EXTENDED_RELEASE_TABLET | Freq: Every day | ORAL | 0 refills | Status: DC

## 2022-09-22 MED ORDER — MODAFINIL 200 MG PO TABS: ORAL_TABLET | ORAL | 1 refills | Status: DC

## 2022-09-22 MED ORDER — FLUOXETINE HCL 20 MG PO CAPS: 60.0000 mg | ORAL_CAPSULE | Freq: Every day | ORAL | 0 refills | Status: DC

## 2022-09-22 NOTE — Progress Notes (Signed)
Kim [name] [id number] Oct 18, 2000 21 y.o.  Video Visit via My Chart  I connected with pt by video using My Chart and verified that I am speaking with the correct person using two identifiers.   I discussed the limitations, risks, security and privacy concerns of performing an evaluation and management service by My Chart  and the availability of in person appointments. I also discussed with the patient that there may be a patient responsible charge related to this service. The patient expressed understanding and agreed to proceed.  I discussed the assessment and treatment plan with the patient. The patient was provided an opportunity to ask questions and all were answered. The patient agreed with the plan and demonstrated an understanding of the instructions.   The patient was advised to call back or seek an in-person evaluation if the symptoms worsen or if the condition fails to improve as anticipated.  I provided 30 minutes of video time during this encounter.  The patient was located at home and the provider was located office. Session from 11 30-1200  Subjective:   Patient ID:  Kim [name] is a 22 y.o. (DOB Jul 08, 2001) female.  Chief Complaint:  Chief Complaint  Patient presents with   Follow-up   Depression   Anxiety    HPI Kim [name] presents today for follow-up of OCD, ADD, depression  02/20/20 appt with the following noted: Seen with mother Kim [name] and father Kim [name].  Rob needing psych help but gave up his appt for daughter to be seen before going to school. She's going to [place] to school. She has OCD, ADD, Tourette's and depression.  They wonder about other med changes. She needs liquids bc anxiety swallowing pills. Pt doesn't drive DT anxiety. Would like help with OCD.  Hesitant about ADD meds but poor tolerance with anxiety. Contamination fears with handwashing.  Germs in general and fear of vomtiing so hard to take pills. Handwashes time hard to say  but a couple of minutes.  Irritating time.   Triggers include cleaning.   She will avoid hospitals and in general doctors offices. Mo concerned OCD takes a lot of time and attention. She recognizes OCD started in middle school. Parents in support of her going to school Depressed but not severe.  Not a direct consequence of OCD.  Feels exhausted and not motivated. Sleep about 9 hours.  Energy OK.  Not much to concentrate on.  Hard to read. ADD lack of focus is a problem on reading and is a concern for school.  Had racing heart before stimulants and they made it worse. Can get this with anxiety and get dizzy. Leaves for school 8/31. [employer] first year.  In [place].    03/07/20 appt with the following noted: Abilify 5 definitely helped with depression. Dep 3/10.   More active.  Better productivity.  Better mood. Some difference with OCD.  Not as big improvement as with depression but better.  Not sure about time spent with OCD.  Anxiety generally 5/10/  Worst times 8/10.   Biggest concern about OCD and school is the potential time involved.  Went time to do school work sometimes hard to resist OCD.  Not very concerned about OCD affecting her socially.   SE: Some hunger at times.  Restlessness fine now.   Plan: Continue Lexapro at current dose Increase Abilify to 7-8 mg now and if improved then no higher. If no improvement then increase to 10 mg daily.  06/25/2020 appointment with the following noted: Mostly good but not much improvement with OCD.  Depression changing like usual.  Weather affects her and other factors.  History of seasonal depression.  More tired and sleep more.  Annoying but not a huge deal.   Increase in Abilify without sig change.   Finishes exams Thursday and break for a month. No SE.   Able to enjoy things. Handwashing 5 min each time she washes them.  Hard to know how much total time. Tremor before Lexapro.    Plan: Continue Lexapro but increase to 35 mg daily  for 2 weeks, then 40 mg daily. High dose medically necessary.  Needs liquid and couldn't tolerate the sertraline. After exams drop Abilify back to 5 mg bc not better with increase to 10 mg.  08/12/2020 telephone call with the following noted: Kim [name] was taken off of Abilify and had her Lexapro increased. Since doing this she is feeling worse, more depressed and lack of energy. She would like to know what she needs to do to help with this MD response: Her last appointment was 06/25/2020.  Prior to that appointment we had increased Abilify from 5 mg to 10 mg without additional improvement.  Therefore the instructions were to reduce the Abilify back to 5 mg daily.  She was not instructed to discontinue it. Because the increase in Abilify had not been helpful from 5 to 10 mg, in addition to reducing the Abilify back to 5 mg she was to increase the Lexapro from 30 mg to 40 mg. It is unlikely that increasing Lexapro would make her more depressed.  I think the depression is worse because she stopped the Abilify apparently.  The increase in Lexapro could possibly make her more fatigued but worsening depression could be causing the fatigue.  The benefit from restarting Abilify 5 mg daily should be quick, that is benefit within a week. Therefore I suggest she continue the current dose of Lexapro and add 5 mg of Abilify back.  The depression should improve within a week or so.  If she is not better by February 10 then call us back  11/10/2020 appt noted: Stayed 1 semester at [employer] not a good fit and left.  Goiing to [employer] in the fall. Felt worse off the Abilify and restarted at 5 mg 08/12/20 and felt better energy. Doing [name] good, [name] busy.  Couple of classes at [employer] just to get some credit.s OCD is about the same without much change with increase in Lexapro.  No SE noted. Anxiety is fairly high usually associated with obsessive thoughts. Not too much depression. Sleep  regular. NO SE.   Not regular Zofran but occ and not sure what causes the nausea. Plan: Reduce Lexapro to 20 ml and add 5 ml fluoxetine for 1 week, Then reduce Lexapro to 10 ml and increase fluoxetine to 10 ml for 1 week, Then stop Lexapro and increase fluoxetine to 15 ml daily  02/16/2021 appointment with the following noted:  Seen with DAD Switched to fluoxetine 15 ml and energy and motivation and mood is better.  Anxiety is a little better but not b much.  OCD is about the same. No problems with the switch in meds.   Hard to tell how much time OCD takes bc she can mutifunciton while does compulsions. Avoids contaminants and handwashes but can work at [employer]. No SE.   Not really depressed.   Takes Zofran intermittently a couple  times per week can be triggered with caffeine. Father says she's seemed to be doing well and excited about school. Going to [employer] to [employer]. Less tremor. Plan: Option increase fluoxetine to 80 mg daily for OCD.  She'd like to try it.  07/01/2021 appointment with the following noted: Didn't increase bc fluoxetine dose is the same. 60 mg daily. Been doing fairly well except for when off for several weeks.  When on it I feel good. No SE Don't like the quillivant XR bc triggers intrusive SI and when coming off has depression and SI.  Not had this with other stimulants.  Takes it rarely. Still on aripiprazole 5 ml daily. No other SE. Mildly depressed.   Can have a good portion of time with OCD but doesn't always interfere with other things. Still nausea and not sure why.  Not caused by meds. When off Prozac grades went down. Goes back to school Jan 11.  01/04/2022 appointment with the following noted: Psychiatric hospitalization 11/06/2021 until 11/12/2021 at [facility] in [place] for depression with suicidal ideation after drinking alcohol with a bottle of cough syrup and use of marijuana as well as noncompliance with psychiatric  medications. Prescribed Abilify 5 mg daily along with fluoxetine 40 mg daily She's at home. She doesn't remember much about how she was doing then.  She had stopped meds a month or 2 before hosp but not sure why.  Before hosp was a few drinks 4 nights a week.  With drew from 1 class and passed the other 2.  Was smoking marijuana not daily but soemtimes multiple times per day.  Sometimes missed classes from oversleeping.  No opiates.   I think I might be bipolar bc was careening back and forth emotionally. Didn't sleep last night. Done some research on bipolar 2 including hyperactivity, with bursts of creativity and not sleeping. Also hypertalkative and hypersexual and risk taking and grandiose thinking.  Upswings might last days.  Mixed states also. Distinct depressions might last weeks.  Not much normal mood. Been at beach with friend last week.  No comments from family or friend. F thinks she might be bipolar. Has compulsions but not anxious about it. Handwashing a lot less than in the past. No panic and no avoidance. Had anxiety about classes bc wasn't prepared enough. Swallowing is better now. Nausea for years without pattern. Consistent with meds. Consistent with Abilify 5 and fluoxetine 80 Plan: She elects to increase Abilify to 10 mg daily.  01/22/22 appt noted: Increase Abilify 10 mg daily with SE mouth tics and reduced to 5 mg daily.  No one commented.  Moving around jaw would cause some pain soreness..  better on lower dose re: SE. Reduced just recently. Mood has been high and probably hypomanic or manic. Sometimes hears people knock on window or hears her name.  Weird. Staying up late and some EMA.  Avg 4-6 hours per night.  Some nights no sleep.   Mood and confidence and energy increase.  Always a bad spender, some increased risk taking behavior.  More talkative. Anxiety has been alright and not as bad as it was.   Nausea but no other sx physically. Plan: Trileptal and increase  to 150 mg every morning and 300 mg nightly.   Reduced fluxoeine to 60 DT rapid cycling Reduced Abilify to 5 mg daily. Continue Vyvanse 30  02/12/2022 appointment with the following noted: Not taking Trilepotal, stopped Trileptal DT increase SI so stopped.  Took  it a few days. Still having some SI.  Not all the time but not too strong bc no plan.  If sees knife will have the thought.   Off Abilify. Has been depressed since after vacation.  Low energy and mood, little motivation.  Neg self talk. No irritability or anger. Sleep too much 10+ hours.  No napping. Not doing much.   At the beach was feeling fine.   Main stress, boredom and loneliness. Planning to return to school and feels capable.  Benefit structure and friends. Not much anxiety or OCD. Mouth tics resolved. Persistent N and lost 10# bc hard to eat. Plan: Start Lithobid 300 mg nightly x 4 days then 600 mg nightly.  03/19/22 appt noted: Sharing apt. Good with room mates. Start Lithobid 300 mg nightly x 4 days then 600 mg nightly.  No effect noted. No SI Up and down with downs more noticeable.  Poor concept of time.  But days duration. With lows doesn't want to do anything. Only missed 2 classes.  Lays around when depressed. Sometimes can tell some mania and high energy and want to do so much stuff.Marland Kitchen  around the end of last week lasting 4-5 days. Not suire about mood now, can't identify how I'm feeling. Seeing out people and don't want to be alone.  Wouldn't say I'm not depressed. Not much anxiety. OCD not bad at all Rare trazodone. Worked with some hangover. nNausea is about the same. No cause known and has appt with doctor. Mouth tics much better off Abilify Sleep irregular from 4-12 hours.  Sometimes don't feel like sleeping.    04/21/22 appt noted: Taking lithium 600 mg daily. Missed Vyvanse and N is better.  Did help with ADD. Sleep is better but more than she would like. Willing to try increasing the lithium. She  wants help with ADD but has not tolerated stimulants very well.  She is keeping up with class work for the most part and is not missing classes generally.  She needs to sleep more than she would like about 10 hours.  No major mood swings.  No suicidal thoughts since she was last here.  Anxiety is manageable but she does feel somewhat stressed by her schoolwork. Plan: Still rec increase  Lithobid 900 mg nightly. Check lithium level.  09/22/22 appt noted: Made changes noted above.  So much better with change to daylight savings time.  But has had a lot of depression and some SI.  Really bad Nov-Dec .  Didn't go back to college.  Not good Jan & Feb.  March better with spring but not as good as she would like.  SI as recent as last week.  Can be triggered if feeling unwanted in social situations.  But can be triggered without reason.   Dep better on spring break with friends lately.  Overall dep 6-7/10 and anxiety 4/10 lately. No sig manic sx since here.   No sig SE.     Therapist Kim [name], Landscape architect. Previous Kim [name] without help.  GF OCD. F bipolar lithium with benefit.  Past Psychiatric Medication Trials: History of Tourette's, ADD, OCD History of ADD brief trials Adderall XR 15, guanfacine ER 1 tired, methylphenidate, Strattera Quillevent SI Vyvanse N Modafinil 100 tolerated. Lithium 900 tolerated Lexapro 40, sertraline liquid brief hard to swallow,   Fluoxetine 60 better mood mirtazapine, Wellbutrin  Abilify 10 EPS & no better than 5 wheich helped. Trileptal brief increase SI  Review of Systems:  Review of Systems  Cardiovascular:  Negative for palpitations.  Gastrointestinal:  Negative for nausea.  Neurological:  Negative for dizziness and tremors.  Psychiatric/Behavioral:  Negative for sleep disturbance.     Medications: I have reviewed the patient's current medications.  Current Outpatient Medications  Medication Sig Dispense Refill   ondansetron (ZOFRAN-ODT)  8 MG disintegrating tablet Take 1 tablet (8 mg total) by mouth every 8 (eight) hours as needed for nausea or vomiting. 20 tablet 0   traZODone (DESYREL) 50 MG tablet Take 1 tablet (50 mg total) by mouth at bedtime as needed for sleep. 30 tablet 0   FLUoxetine (PROZAC) 20 MG capsule Take 3 capsules (60 mg total) by mouth daily. 270 capsule 0   lithium carbonate (LITHOBID) 300 MG ER tablet Take 3 tablets (900 mg total) by mouth daily at 12 noon. 270 tablet 0   modafinil (PROVIGIL) 200 MG tablet 1 in the morning 30 tablet 1   No current facility-administered medications for this visit.    Medication Side Effects: None  Allergies: No Known Allergies  Past Medical History:  Diagnosis Date   Constipation    GERD (gastroesophageal reflux disease)    Granuloma of skin    on forehead    Family History  Problem Relation Age of Onset   GER disease Father    Ulcerative colitis Father    ADD / ADHD Father    Anxiety disorder Father    Depression Father    Bipolar disorder Father    Migraines Paternal Grandmother    Anxiety disorder Paternal Grandmother    Depression Paternal Grandmother    Seizures Neg Hx    Autism Neg Hx    Schizophrenia Neg Hx     Social History   Socioeconomic History   Marital status: Single    Spouse name: n/a   Number of children: 0   Years of education: Not on file   Highest education level: Not on file  Occupational History   Occupation: student  Tobacco Use   Smoking status: Never   Smokeless tobacco: Never  Vaping Use   Vaping Use: Never used  Substance and Sexual Activity   Alcohol use: No   Drug use: No   Sexual activity: Never  Other Topics Concern   Not on file  Social History Narrative   Lives with mom, dad, brother in the same house. She is in the 11th grade at [employer]. Does well in school. She enjoys listening to music, writing and singing.    Social Determinants of Health   Financial Resource Strain: Not on file  Food  Insecurity: Not on file  Transportation Needs: Not on file  Physical Activity: Not on file  Stress: Not on file  Social Connections: Not on file  Intimate Partner Violence: Not on file    Past Medical History, Surgical history, Social history, and Family history were reviewed and updated as appropriate.   Please see review of systems for further details on the patient's review from today.   Objective:   Physical Exam:  There were no vitals taken for this visit.  Physical Exam Neurological:     Mental Status: She is alert and oriented to person, place, and time.     Cranial Nerves: No dysarthria.  Psychiatric:        Attention and Perception: Attention and perception normal.        Mood and Affect: Mood is anxious and depressed.  Speech: Speech normal.        Behavior: Behavior is cooperative.        Thought Content: Thought content normal. Thought content is not paranoid or delusional. Thought content does not include homicidal or suicidal ideation. Thought content does not include suicidal plan.        Cognition and Memory: Cognition and memory normal.        Judgment: Judgment normal.     Comments: Insight intact Anxiety manageable  Depression worse seasonally     Lab Review:  No results found for: "NA", "K", "CL", "CO2", "GLUCOSE", "BUN", "CREATININE", "CALCIUM", "PROT", "ALBUMIN", "AST", "ALT", "ALKPHOS", "BILITOT", "GFRNONAA", "GFRAA"  No results found for: "WBC", "RBC", "HGB", "HCT", "PLT", "MCV", "MCH", "MCHC", "RDW", "LYMPHSABS", "MONOABS", "EOSABS", "BASOSABS"  No results found for: "POCLITH", "LITHIUM"   No results found for: "PHENYTOIN", "PHENOBARB", "VALPROATE", "CBMZ"   Review Gene[employer] testing dated September 30, 2017.  SLC 6 A4 was long long.  HDR2A was normal genotype. Pharmacokinetic genes included normal 2D6, 3 A4, 2 C9, 2C19, 1A2, and intermediate 2B 6  .res Assessment: Plan:    Jakita was seen today for follow-up, depression and  anxiety.  Diagnoses and all orders for this visit:  Bipolar affective disorder, rapid cycling (HCC) -     lithium carbonate (LITHOBID) 300 MG ER tablet; Take 3 tablets (900 mg total) by mouth daily at 12 noon.  Mixed obsessional thoughts and acts -     FLUoxetine (PROZAC) 20 MG capsule; Take 3 capsules (60 mg total) by mouth daily.  Attention deficit hyperactivity disorder (ADHD), combined type, moderate -     modafinil (PROVIGIL) 200 MG tablet; 1 in the morning  Tourette's disorder  Dysautonomia (HCC)  Difficulty swallowing pills  Chronic nausea   Greater than 50% of 30 min face to face time with patient was spent on counseling and coordination of care.  Extended time because required because of recent hospitalization and additional psychiatric diagnosis.  Psychiatric hospitalization 11/06/2021 until 11/12/2021 at [facility] in [place] for depression with suicidal ideation after drinking alcohol with a bottle of cough syrup and use of marijuana as well as noncompliance with psychiatric medications. Prescribed Abilify 5 mg daily along with fluoxetine 40 mg daily.Had EPS with incrased Abilify to 10 mg daily.   Stopped DT "mouth tics".  Discussed her concerns that she may have bipolar disorder type II, recent hypomania.  She has done some reading on the subject.  That appears to be accurate.  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  We discussed how the presence of SSRIs used for her OCD may increase the risk of rapid cycling.  However there is not a good alternative to SSRIs for OCD. Extensive discussion about alternative types of mood stabilizers including lithium, Depakote and atypical antipsychotics.  Lithium is not reliably effective for rapid cycling but father is bipolar with benefit from lithium.  We discussed the option of Depakote but not ideal in woman of child bearing age but we may have little other options DT apprent SE antipsychotic.  Because of "mouth  tics" would like to avoid antipsychotics if possible. They nealry resolved off the abilify.  No abnormal involuntary movements were observed in the video and  . Needed the option of alternative mood stabilizer. She noticed an increase in suicidal thoughts a few days on the Trileptal so stopped it. Due to the suicidal thoughts lithium would be a good choice.  Her father also responded to lithium.  Informed her we could go up in the dose if she starts developing manic symptoms.  We discussed again the importance of the mood stabilizer. She may get additional benefit higher dose and given the severity of previous symptoms would recommend she try it.  She can always reduce the dose if she feels like she does not tolerate it well or does not see additional benefit.  She agrees to do this. We will use Lithobid because of nausea problems. Continue Lithobid 900 mg nightly. Check lithium level.  Counseled patient regarding potential benefits, risks, and side effects of lithium to include potential risk of lithium affecting thyroid and renal function.  Discussed need for periodic lab monitoring to determine drug level and to assess for potential adverse effects.  Counseled patient regarding signs and symptoms of lithium toxicity and advised that they notify office immediately or seek urgent medical attention if experiencing these signs and symptoms.  Patient advised to contact office with any questions or concerns.  Due to rapid cycling we have reduced the fluoxetine to 40 mg daily. Took 60 for 7 weeks.   Increase fluoxetine to 60 mg daily for depression.     She has learned to swallow capsules while at the hospital.  We discussed the risk of worsening OCD at the lower dose  Consider Auvelity bc father trying it now.  Defer to see if fluoxetine works.answered questions about Spravato.    Coffee worsens tremor discussed and she's aware.  Modafinil off label DT poor tolerance stimulants, modafinil 100-200 mg  AM.  Off label for ADD bc N with Vyvanse and poor mood tolerance. Tolerating this well.  Discussed the importance of avoiding marijuana and alcohol abuse at this time  We discussed her father's psychiatric meds and the potential for use of lamotrigine.  We will we will defer this choice at this time in order to keep medications as simple as possible  vitamin D 4000-5000 units daily in winter.  Disc counseling  Consider lamotrigine for seasonal depression  Call if symptoms worsen or she has significant side effects.  FU 2 mos  Lynder Parents, MD, DFAPA  Please see After Visit Summary for patient specific instructions.  Future Appointments  Date Time Provider [place]  11/03/2022 11:30 AM Cottle, Billey Co., MD [facility] None        No orders of the defined types were placed in this encounter.    -------------------------------

## 2022-09-29 ENCOUNTER — Telehealth: Payer: Self-pay | Admitting: Psychiatry

## 2022-09-29 NOTE — Telephone Encounter (Signed)
LVM to RC 

## 2022-09-29 NOTE — Telephone Encounter (Signed)
Jonnie called at 9:20 wanting to discuss possible side effects from the medication changes made at her last appt.  Please call.

## 2022-09-30 ENCOUNTER — Other Ambulatory Visit: Payer: Self-pay | Admitting: Psychiatry

## 2022-09-30 DIAGNOSIS — F319 Bipolar disorder, unspecified: Secondary | ICD-10-CM

## 2022-09-30 NOTE — Telephone Encounter (Signed)
Patient was last seen on 3/13 and her fluoxetine dose was increased. She is reporting frequent mood changes, said she was probably hypomanic, now describing increased depression, having anger outbursts which she said is not like her. She reports intermittent awakening at night, but is able to go back to sleep.   She also had an issue with not being able to get her modafinil. Your note says 100-200, but 200 mg Rx was sent in. I called [facility] to see what the issue was and I'm still not sure, but got that issue worked out. Pharmacy does not have in stock. I called [facility] on [address], which is where she had requested the Rx be sent and they don't have in stock either.  I have asked her to locate and we could send in a Rx for her. I don't know what role this may play in her sx. She is going out of town for a week next week and is trying to get things straightened out.   Due to rapid cycling we have reduced the fluoxetine to 40 mg daily. Took 60 for 7 weeks.   Increase fluoxetine to 60 mg daily for depression.  Modafinil off label DT poor tolerance stimulants, modafinil 100-200 mg AM.

## 2022-09-30 NOTE — Telephone Encounter (Signed)
We need to stop the mood swings as top priority bc the depression will not be controlled as long as having mood swings.  Lithium should be preventing the mood swings but is not and that may be due to blood level being too low.  I have requested her to get a blood level sometime ago but I have never received 1.  In order to speed the process up have her increase the lithium to 4 tablets daily and get a blood level in a week.  The day before the blood test take the lithium as normal and get the blood test approximately 12 hours after the last dose of lithium.  It is very important that she get that level so we can determine whether lithium is going to work for her or not.  But she needs to wait 5 to 7 days after increasing the lithium to 4 tablets daily before getting the blood level. Ill send lihtium level order to [facility].  Modafinil 200 mg tablets is generally available at [facility].  It will not be covered by insurance and she will need to use good Rx to get it as a reasonable price

## 2022-10-01 ENCOUNTER — Other Ambulatory Visit: Payer: Self-pay

## 2022-10-01 ENCOUNTER — Other Ambulatory Visit: Payer: Self-pay | Admitting: Psychiatry

## 2022-10-01 DIAGNOSIS — F902 Attention-deficit hyperactivity disorder, combined type: Secondary | ICD-10-CM

## 2022-10-01 MED ORDER — MODAFINIL 200 MG PO TABS: ORAL_TABLET | ORAL | 1 refills | Status: DC

## 2022-10-01 NOTE — Telephone Encounter (Signed)
This was sined 1 week ago but last picked up 3/1

## 2022-10-01 NOTE — Telephone Encounter (Signed)
LVM to RC 

## 2022-10-01 NOTE — Telephone Encounter (Signed)
Pt called back at 1:40p.  She is asking about the Modafinil to [facility] [address].

## 2022-10-01 NOTE — Telephone Encounter (Signed)
Patient was notified of recommendations

## 2022-10-07 ENCOUNTER — Telehealth: Payer: Self-pay | Admitting: Psychiatry

## 2022-10-07 ENCOUNTER — Other Ambulatory Visit: Payer: Self-pay

## 2022-10-07 DIAGNOSIS — F902 Attention-deficit hyperactivity disorder, combined type: Secondary | ICD-10-CM

## 2022-10-07 MED ORDER — MODAFINIL 200 MG PO TABS: ORAL_TABLET | ORAL | 0 refills | Status: DC

## 2022-10-07 NOTE — Telephone Encounter (Signed)
Kim [name] called at 3:00pm.  She said the pharmacy here never filled her modafinil.  She is now in [place] and needs the prescription sent to a pharmacy there.  [facility] at [address] [place], [place].  Appt 4/24

## 2022-10-07 NOTE — Telephone Encounter (Signed)
Canceled Rx at local [facility].

## 2022-10-23 DIAGNOSIS — R110 Nausea: Secondary | ICD-10-CM | POA: Diagnosis not present

## 2022-10-23 DIAGNOSIS — L309 Dermatitis, unspecified: Secondary | ICD-10-CM | POA: Diagnosis not present

## 2022-10-23 DIAGNOSIS — R450 Nervousness: Secondary | ICD-10-CM | POA: Diagnosis not present

## 2022-10-25 ENCOUNTER — Telehealth: Payer: Self-pay | Admitting: Psychiatry

## 2022-10-25 NOTE — Telephone Encounter (Signed)
RTC  Lithium level 1.1.  stopped a couple of days ago.  Complaining of sx starting Thursday.   No change in sx or meds.  Was feeling fatigued.   Drinking fluids now.   Stopped lithium Saturday.   Stay off lithium for a couple of days and then call back when sx resolve.  No other changes.   No SI lately.    She agrees. Meredith Staggers, MD, DFAPA

## 2022-10-25 NOTE — Telephone Encounter (Signed)
Pt called 1:04pm today. [facility] and they took bloodwork on her but think she had lithium toxicity. She went to [employer] in [facility]. She was taking 1200mg  of lithium right now. She is not sure what to do and lab results haven't come back yet. (Shakiness, Unsteady, Dizzy, cloudy, Vision blurred, stomach upset , fatigued, body aches)

## 2022-10-25 NOTE — Telephone Encounter (Signed)
Please see message from patient. I don't see result in Care Everywhere. I called [facility] and got VM.

## 2022-10-26 DIAGNOSIS — R110 Nausea: Secondary | ICD-10-CM | POA: Diagnosis not present

## 2022-10-26 DIAGNOSIS — R55 Syncope and collapse: Secondary | ICD-10-CM | POA: Diagnosis not present

## 2022-11-01 NOTE — Telephone Encounter (Signed)
Call to check on her.  Have the SE resolved?

## 2022-11-02 ENCOUNTER — Ambulatory Visit (INDEPENDENT_AMBULATORY_CARE_PROVIDER_SITE_OTHER): Payer: BC Managed Care – PPO | Admitting: Psychiatry

## 2022-11-02 ENCOUNTER — Encounter: Payer: Self-pay | Admitting: Psychiatry

## 2022-11-02 DIAGNOSIS — G901 Familial dysautonomia [Riley-Day]: Secondary | ICD-10-CM

## 2022-11-02 DIAGNOSIS — F422 Mixed obsessional thoughts and acts: Secondary | ICD-10-CM | POA: Diagnosis not present

## 2022-11-02 DIAGNOSIS — F952 Tourette's disorder: Secondary | ICD-10-CM

## 2022-11-02 DIAGNOSIS — F319 Bipolar disorder, unspecified: Secondary | ICD-10-CM

## 2022-11-02 DIAGNOSIS — Z79899 Other long term (current) drug therapy: Secondary | ICD-10-CM

## 2022-11-02 DIAGNOSIS — F902 Attention-deficit hyperactivity disorder, combined type: Secondary | ICD-10-CM | POA: Diagnosis not present

## 2022-11-02 DIAGNOSIS — T56891A Toxic effect of other metals, accidental (unintentional), initial encounter: Secondary | ICD-10-CM

## 2022-11-02 DIAGNOSIS — R251 Tremor, unspecified: Secondary | ICD-10-CM

## 2022-11-02 MED ORDER — PROPRANOLOL HCL 10 MG PO TABS: ORAL_TABLET | ORAL | 1 refills | Status: DC

## 2022-11-02 NOTE — Progress Notes (Signed)
Kim Kim [name] [id number] 11/06/2000 21 y.o.   Subjective:   Patient ID:  Kim Kim [name] is a 22 y.o. (DOB [birth year]) female.  Chief Complaint:  Chief Complaint  Patient presents with   Follow-up   Depression   Anxiety   Medication Problem    HPI Kim Kim [name] presents today for follow-up of OCD, ADD, depression  02/20/20 appt with the following noted: Seen with mother Kim Kim [name] and father Kim Kim [name].  Kim [name] needing psych help but gave up his appt for daughter to be seen before going to school. She's going to [place] to school. She has OCD, ADD, Tourette's and depression.  They wonder about other med changes. She needs liquids bc anxiety swallowing pills. Pt doesn't drive DT anxiety. Would like help with OCD.  Hesitant about ADD meds but poor tolerance with anxiety. Contamination fears with handwashing.  Germs in general and fear of vomtiing so hard to take pills. Handwashes time hard to say but a couple of minutes.  Irritating time.   Triggers include cleaning.   She will avoid hospitals and in general doctors offices. Mo concerned OCD takes a lot of time and attention. She recognizes OCD started in middle school. Parents in support of her going to school Depressed but not severe.  Not a direct consequence of OCD.  Feels exhausted and not motivated. Sleep about 9 hours.  Energy OK.  Not much to concentrate on.  Hard to read. ADD lack of focus is a problem on reading and is a concern for school.  Had racing heart before stimulants and they made it worse. Can get this with anxiety and get dizzy. Leaves for school 8/31. Kim Kim [name] first year.  In [place].    03/07/20 appt with the following noted: Abilify 5 definitely helped with depression. Dep 3/10.   More active.  Better productivity.  Better mood. Some difference with OCD.  Not as big improvement as with depression but better.  Not sure about time spent with OCD.  Anxiety generally 5/10/  Worst times 8/10.   Biggest concern  about OCD and school is the potential time involved.  Went time to do school work sometimes hard to resist OCD.  Not very concerned about OCD affecting her socially.   SE: Some hunger at times.  Restlessness fine now.   Plan: Continue Lexapro at current dose Increase Abilify to 7-8 mg now and if improved then no higher. If no improvement then increase to 10 mg daily.   06/25/2020 appointment with the following noted: Mostly good but not much improvement with OCD.  Depression changing like usual.  Weather affects her and other factors.  History of seasonal depression.  More tired and sleep more.  Annoying but not a huge deal.   Increase in Abilify without sig change.   Finishes exams Thursday and break for a month. No SE.   Able to enjoy things. Handwashing 5 min each time she washes them.  Hard to know how much total time. Tremor before Lexapro.    Plan: Continue Lexapro but increase to 35 mg daily for 2 weeks, then 40 mg daily. High dose medically necessary.  Needs liquid and couldn't tolerate the sertraline. After exams drop Abilify back to 5 mg bc not better with increase to 10 mg.  08/12/2020 telephone call with the following noted: Kim Kim [name] was taken off of Abilify and had her Lexapro increased. Since doing this she is feeling worse, more depressed and lack of energy. She would like  to know what she needs to do to help with this MD response: Her last appointment was 06/25/2020.  Prior to that appointment we had increased Abilify from 5 mg to 10 mg without additional improvement.  Therefore the instructions were to reduce the Abilify back to 5 mg daily.  She was not instructed to discontinue it. Because the increase in Abilify had not been helpful from 5 to 10 mg, in addition to reducing the Abilify back to 5 mg she was to increase the Lexapro from 30 mg to 40 mg. It is unlikely that increasing Lexapro would make her more depressed.  I think the depression is worse because she stopped the Abilify  apparently.  The increase in Lexapro could possibly make her more fatigued but worsening depression could be causing the fatigue.  The benefit from restarting Abilify 5 mg daily should be quick, that is benefit within a week. Therefore I suggest she continue the current dose of Lexapro and add 5 mg of Abilify back.  The depression should improve within a week or so.  If she is not better by February 10 then call us back  11/10/2020 appt noted: Stayed 1 semester at [employer] not a good fit and left.  Goiing to [employer] in the fall. Felt worse off the Abilify and restarted at 5 mg 08/12/20 and felt better energy. Doing pretty good, pretty busy.  Couple of classes at [employer] just to get some credit.s OCD is about the same without much change with increase in Lexapro.  No SE noted. Anxiety is fairly high usually associated with obsessive thoughts. Not too much depression. Sleep regular. NO SE.   Not regular Zofran but occ and not sure what causes the nausea. Plan: Reduce Lexapro to 20 ml and add 5 ml fluoxetine for 1 week, Then reduce Lexapro to 10 ml and increase fluoxetine to 10 ml for 1 week, Then stop Lexapro and increase fluoxetine to 15 ml daily  02/16/2021 appointment with the following noted:  Seen with DAD Switched to fluoxetine 15 ml and energy and motivation and mood is better.  Anxiety is a little better but not b much.  OCD is about the same. No problems with the switch in meds.   Hard to tell how much time OCD takes bc she can mutifunciton while does compulsions. Avoids contaminants and handwashes but can work at [employer]. No SE.   Not really depressed.   Takes Zofran intermittently a couple times per week can be triggered with caffeine. Father says she's seemed to be doing well and excited about school. Going to [employer] to [employer]. Less tremor. Plan: Option increase fluoxetine to 80 mg daily for OCD.  She'd like to try  it.  07/01/2021 appointment with the following noted: Didn't increase bc fluoxetine dose is the same. 60 mg daily. Been doing fairly well except for when off for several weeks.  When on it I feel good. No SE Don't like the quillivant XR bc triggers intrusive SI and when coming off has depression and SI.  Not had this with other stimulants.  Takes it rarely. Still on aripiprazole 5 ml daily. No other SE. Mildly depressed.   Can have a good portion of time with OCD but doesn't always interfere with other things. Still nausea and not sure why.  Not caused by meds. When off Prozac grades went down. Goes back to school Jan 11.  01/04/2022 appointment with the following noted:  Psychiatric hospitalization 11/06/2021 until 11/12/2021 at [facility] in [place] for depression with suicidal ideation after drinking alcohol with a bottle of cough syrup and use of marijuana as well as noncompliance with psychiatric medications. Prescribed Abilify 5 mg daily along with fluoxetine 40 mg daily She's at home. She doesn't remember much about how she was doing then.  She had stopped meds a month or 2 before hosp but not sure why.  Before hosp was a few drinks 4 nights a week.  With drew from 1 class and passed the other 2.  Was smoking marijuana not daily but soemtimes multiple times per day.  Sometimes missed classes from oversleeping.  No opiates.   I think I might be bipolar bc was careening back and forth emotionally. Didn't sleep last night. Done some research on bipolar 2 including hyperactivity, with bursts of creativity and not sleeping. Also hypertalkative and hypersexual and risk taking and grandiose thinking.  Upswings might last days.  Mixed states also. Distinct depressions might last weeks.  Not much normal mood. Been at beach with friend last week.  No comments from family or friend. F thinks she might be bipolar. Has compulsions but not anxious about it. Handwashing a lot less than in the past. No  panic and no avoidance. Had anxiety about classes bc wasn't prepared enough. Swallowing is better now. Nausea for years without pattern. Consistent with meds. Consistent with Abilify 5 and fluoxetine 80 Plan: She elects to increase Abilify to 10 mg daily.  01/22/22 appt noted: Increase Abilify 10 mg daily with SE mouth tics and reduced to 5 mg daily.  No one commented.  Moving around jaw would cause some pain soreness..  better on lower dose re: SE. Reduced just recently. Mood has been high and probably hypomanic or manic. Sometimes hears people knock on window or hears her name.  Weird. Staying up late and some EMA.  Avg 4-6 hours per night.  Some nights no sleep.   Mood and confidence and energy increase.  Always a bad spender, some increased risk taking behavior.  More talkative. Anxiety has been alright and not as bad as it was.   Nausea but no other sx physically. Plan: Trileptal and increase to 150 mg every morning and 300 mg nightly.   Reduced fluxoeine to 60 DT rapid cycling Reduced Abilify to 5 mg daily. Continue Vyvanse 30  02/12/2022 appointment with the following noted: Not taking Trilepotal, stopped Trileptal DT increase SI so stopped.  Took it a few days. Still having some SI.  Not all the time but not too strong bc no plan.  If sees knife will have the thought.   Off Abilify. Has been depressed since after vacation.  Low energy and mood, little motivation.  Neg self talk. No irritability or anger. Sleep too much 10+ hours.  No napping. Not doing much.   At the beach was feeling fine.   Main stress, boredom and loneliness. Planning to return to school and feels capable.  Benefit structure and friends. Not much anxiety or OCD. Mouth tics resolved. Persistent N and lost 10# bc hard to eat. Plan: Start Lithobid 300 mg nightly x 4 days then 600 mg nightly.  03/19/22 appt noted: Sharing apt. Good with room mates. Start Lithobid 300 mg nightly x 4 days then 600 mg nightly.   No effect noted. No SI Up and down with downs more noticeable.  Poor concept of time.  But days duration. With lows doesn't want to  do anything. Only missed 2 classes.  Lays around when depressed. Sometimes can tell some mania and high energy and want to do so much stuff.Marland Kitchen  around the end of last week lasting 4-5 days. Not suire about mood now, can't identify how I'm feeling. Seeing out people and don't want to be alone.  Wouldn't say I'm not depressed. Not much anxiety. OCD not bad at all Rare trazodone. Worked with some hangover. nNausea is about the same. No cause known and has appt with doctor. Mouth tics much better off Abilify Sleep irregular from 4-12 hours.  Sometimes don't feel like sleeping.    04/21/22 appt noted: Taking lithium 600 mg daily. Missed Vyvanse and N is better.  Did help with ADD. Sleep is better but more than she would like. Willing to try increasing the lithium. She wants help with ADD but has not tolerated stimulants very well.  She is keeping up with class work for the most part and is not missing classes generally.  She needs to sleep more than she would like about 10 hours.  No major mood swings.  No suicidal thoughts since she was last here.  Anxiety is manageable but she does feel somewhat stressed by her schoolwork. Plan: Still rec increase  Lithobid 900 mg nightly. Check lithium level.  09/22/22 appt noted: Made changes noted above.  So much better with change to daylight savings time.  But has had a lot of depression and some SI.  Really bad Nov-Dec .  Didn't go back to Kim [name].  Not good Jan & Feb.  March better with spring but not as good as she would like.  SI as recent as last week.  Can be triggered if feeling unwanted in social situations.  But can be triggered without reason.   Dep better on spring break with friends lately.  Overall dep 6-7/10 and anxiety 4/10 lately. No sig manic sx since here.   No sig SE.    Plan: Due to rapid cycling we have  reduced the fluoxetine to 40 mg daily. Took 60 for 7 weeks.   Increase fluoxetine to 60 mg daily for depression. Continue lihtium 900, modafinil 100-200 mg AM prn ADD sx., trazodone 50 mg HS prn  09/29/22 TC:  Patient was last seen on 3/13 and her fluoxetine dose was increased. She is reporting frequent mood changes, said she was probably hypomanic, now describing increased depression, having anger outbursts which she said is not like her. She reports intermittent awakening at night, but is able to go back to sleep.    She also had an issue with not being able to get her modafinil.     MD resp: We need to stop the mood swings as top priority bc the depression will not be controlled as long as having mood swings.  Lithium should be preventing the mood swings but is not and that may be due to blood level being too low.  I have requested her to get a blood level sometime ago but I have never received 1.  In order to speed the process up have her increase the lithium to 4 tablets daily and get a blood level in a week.  The day before the blood test take the lithium as normal and get the blood test approximately 12 hours after the last dose of lithium.  It is very important that she get that level so we can determine whether lithium is going to work for her or not.  But she needs to wait 5 to 7 days after increasing the lithium to 4 tablets daily before getting the blood level. Ill send lihtium level order to [facility].     10/25/22 TC:  Pt called 1:04pm today. [facility] and they took bloodwork on her but think she had lithium toxicity. She went to [employer] in Clinic. She was taking 1200mg  of lithium right now. She is not sure what to do and lab results haven't come back yet. (Shakiness, Unsteady, Dizzy, cloudy, Vision blurred, stomach upset , fatigued, body aches)    MD resp:  RTC  Lithium level 1.1.  stopped a couple of days ago.  Complaining of sx starting Thursday.    No change in sx or meds.  Was feeling fatigued.   Drinking fluids now.   Stopped lithium Saturday.   Stay off lithium for a couple of days and then call back when sx resolve.  No other changes.   No SI lately.    She agrees. Meredith Staggers, MD, DFAPA     11/02/22 appt noted: Stopped lithium on weekend.  Restarted at 300 mg daily. Feels a little shakey, nausea, mild dizzy feeling.  Mild tremor. No med changes or additions last week.  Eating and drinking normally. Over the last month mood is pretty good overall.   No SI in the last month. She has felt physically anxious and can't place why.  Not much OCD right now. More energy and focus in the day with modafinil. No NSAIDs. Lithium does seem to work and would like to continue it.   Had some tremor before lithium.  Uncertain if family hisotry of it.   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 Previous [place] [employer] without help.  GF OCD. F bipolar lithium with benefit.  Past Psychiatric Medication Trials: History of Tourette's, ADD, OCD History of ADD brief trials Adderall XR 15, guanfacine ER 1 tired, methylphenidate, Strattera Quillevent SI Vyvanse N Modafinil 100 tolerated. Lithium 900 tolerated Lexapro 40, sertraline liquid brief hard to swallow,   Fluoxetine 60 better mood mirtazapine, Wellbutrin  Abilify 10 EPS "mouth tics" & no better than 5 which helped. Trileptal brief increase SI  Review of Systems:  Review of Systems  Cardiovascular:  Negative for palpitations.  Gastrointestinal:  Positive for nausea.  Neurological:  Positive for dizziness. Negative for tremors.  Psychiatric/Behavioral:  Negative for sleep disturbance.     Medications: I have reviewed the patient's current medications.  Current Outpatient Medications  Medication Sig Dispense Refill   FLUoxetine (PROZAC) 20 MG capsule Take 3 capsules (60 mg total) by mouth daily. (Patient taking differently: Take 40 mg by mouth daily.) 270 capsule 0   modafinil  (PROVIGIL) 200 MG tablet 1 in the morning (Patient taking differently: Take 100 mg by mouth daily. 1 in the morning) 30 tablet 0   ondansetron (ZOFRAN-ODT) 8 MG disintegrating tablet Take 1 tablet (8 mg total) by mouth every 8 (eight) hours as needed for nausea or vomiting. 20 tablet 0   propranolol (INDERAL) 10 MG tablet Take 1-2 tabs po BID prn anxiety or tremor 120 tablet 1   traZODone (DESYREL) 50 MG tablet Take 1 tablet (50 mg total) by mouth at bedtime as needed for sleep. 30 tablet 0   lithium carbonate (LITHOBID) 300 MG ER tablet Take 3 tablets (900 mg total) by mouth daily at 12 noon. (Patient not taking: Reported on 11/02/2022) 270 tablet 0   No current facility-administered medications for  this visit.    Medication Side Effects: None  Allergies: No Known Allergies  Past Medical History:  Diagnosis Date   Constipation    GERD (gastroesophageal reflux disease)    Granuloma of skin    on forehead    Family History  Problem Relation Age of Onset   GER disease Father    Ulcerative colitis Father    ADD / ADHD Father    Anxiety disorder Father    Depression Father    Bipolar disorder Father    Migraines Paternal Grandmother    Anxiety disorder Paternal Grandmother    Depression Paternal Grandmother    Seizures Neg Hx    Autism Neg Hx    Schizophrenia Neg Hx     Social History   Socioeconomic History   Marital status: Single    Spouse name: n/a   Number of children: 0   Years of education: Not on file   Highest education level: Not on file  Occupational History   Occupation: student  Tobacco Use   Smoking status: Never   Smokeless tobacco: Never  Vaping Use   Vaping Use: Never used  Substance and Sexual Activity   Alcohol use: No   Drug use: No   Sexual activity: Never  Other Topics Concern   Not on file  Social History Narrative   Lives with mom, dad, brother in the same house. She is in the 11th grade at [employer]. Does well in school. She enjoys  listening to music, writing and singing.    Social Determinants of Health   Financial Resource Strain: Not on file  Food Insecurity: Not on file  Transportation Needs: Not on file  Physical Activity: Not on file  Stress: Not on file  Social Connections: Not on file  Intimate Partner Violence: Not on file    Past Medical History, Surgical history, Social history, and Family history were reviewed and updated as appropriate.   Please see review of systems for further details on the patient's review from today.   Objective:   Physical Exam:  There were no vitals taken for this visit.  Physical Exam Neurological:     Mental Status: She is alert and oriented to person, place, and time.     Cranial Nerves: No dysarthria.  Psychiatric:        Attention and Perception: Attention and perception normal.        Mood and Affect: Mood is anxious and depressed.        Speech: Speech normal.        Behavior: Behavior is cooperative.        Thought Content: Thought content normal. Thought content is not paranoid or delusional. Thought content does not include homicidal or suicidal ideation. Thought content does not include suicidal plan.        Cognition and Memory: Cognition and memory normal.        Judgment: Judgment normal.     Comments: Insight intact Anxiety manageable  Depression better at moment. fidgety     Lab Review:  No results found for: "NA", "K", "CL", "CO2", "GLUCOSE", "BUN", "CREATININE", "CALCIUM", "PROT", "ALBUMIN", "AST", "ALT", "ALKPHOS", "BILITOT", "GFRNONAA", "GFRAA"  No results found for: "WBC", "RBC", "HGB", "HCT", "PLT", "MCV", "MCH", "MCHC", "RDW", "LYMPHSABS", "MONOABS", "EOSABS", "BASOSABS"  No results found for: "POCLITH", "LITHIUM"   No results found for: "PHENYTOIN", "PHENOBARB", "VALPROATE", "CBMZ"   Review [facility] testing dated September 30, 2017.  SLC 6 A4 was long long.  HDR2A was  normal genotype. Pharmacokinetic genes included normal 2D6, 3 A4, 2  C9, 2C19, 1A2, and intermediate 2B 6  10/23/22 Lithium level 1.1. while taking 1200 mg day but had   .res Assessment: Plan:    Yemaya was seen today for follow-up, depression, anxiety and medication problem.  Diagnoses and all orders for this visit:  Bipolar affective disorder, rapid cycling -     Lithium level -     Basic metabolic panel -     TSH  Mixed obsessional thoughts and acts  Attention deficit hyperactivity disorder (ADHD), combined type, moderate  Tourette's disorder  Dysautonomia  Lithium toxicity, accidental or unintentional, initial encounter -     propranolol (INDERAL) 10 MG tablet; Take 1-2 tabs po BID prn anxiety or tremor -     Lithium level -     Basic metabolic panel -     TSH  Tremor, unspecified -     propranolol (INDERAL) 10 MG tablet; Take 1-2 tabs po BID prn anxiety or tremor  Lithium use -     Lithium level -     Basic metabolic panel -     TSH   Greater than 50% of 30 min face to face time with patient was spent on counseling and coordination of care.  Extended time because recent sx of mild lithium toxicity and reviewed options for changing.  Psychiatric hospitalization 11/06/2021 until 11/12/2021 at [facility] in [place] for depression with suicidal ideation after drinking alcohol with a bottle of cough syrup and use of marijuana as well as noncompliance with psychiatric medications. Prescribed Abilify 5 mg daily along with fluoxetine 40 mg daily. Had EPS with increased Abilify to 10 mg daily.   Stopped DT "mouth tics".  Discussed her concerns that she may have bipolar disorder type II, recent hypomania.  She has done some reading on the subject.  That appears to be accurate.  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  We discussed how the presence of SSRIs used for her OCD may increase the risk of rapid cycling.  However there is not a good alternative to SSRIs for OCD. Extensive discussion about alternative types of mood  stabilizers including lithium, Depakote and atypical antipsychotics.  Lithium is not reliably effective for rapid cycling but father is bipolar with benefit from lithium.  We discussed the option of Depakote but not ideal in woman of child bearing age but we may have little other options DT apprent SE antipsychotic.  Because of "mouth tics" would like to avoid antipsychotics if possible. They nealry resolved off the abilify.  No abnormal involuntary movements were observed in the video and  . Needed the option of alternative mood stabilizer. She noticed an increase in suicidal thoughts a few days on the Trileptal so stopped it. Due to the suicidal thoughts lithium would be a good choice.  Her father also responded to lithium.  Informed her we could go up in the dose if she starts developing manic symptoms.  We discussed again the importance of the mood stabilizer.  We will use Lithobid because of nausea problems. Increase gradually to  Lithobid 900 mg nightly. Check lithium level.  Counseled patient regarding potential benefits, risks, and side effects of lithium to include potential risk of lithium affecting thyroid and renal function.  Discussed need for periodic lab monitoring to determine drug level and to assess for potential adverse effects.  Counseled patient regarding signs and symptoms of lithium toxicity and advised that they  notify office immediately or seek urgent medical attention if experiencing these signs and symptoms.  Patient advised to contact office with any questions or concerns. Recent sx lithium toxicity resolved.  Due to rapid cycling we have reduced the fluoxetine to 40 mg daily. Took 60 for 7 weeks.   Increased fluoxetine to 60 mg daily for depression and she's cut back to 40 mg daily.  No change today since restarting lihtium.     She has learned to swallow capsules while at the hospital.  We discussed the risk of worsening OCD at the lower dose  Consider Auvelity bc father  trying it now.  Defer to see if fluoxetine works.answered questions about Spravato.    Coffee worsens tremor discussed and she's aware.  Modafinil off label DT poor tolerance stimulants, modafinil 100 mg AM.  Off label for ADD bc N with Vyvanse and poor mood tolerance. Tolerating this well. And it helped.   Discussed the importance of avoiding marijuana and alcohol abuse at this time  We discussed her father's psychiatric meds and the potential for use of lamotrigine.  We will we will defer this choice at this time in order to keep medications as simple as possible  vitamin D 4000-5000 units daily in winter.  Disc counseling  Consider lamotrigine for seasonal depression  Call if symptoms worsen or she has significant side effects.  FU 2 mos  Meredith Staggers, MD, DFAPA  Please see After Visit Summary for patient specific instructions.  Future Appointments  Date Time Provider Department Center  11/12/2022 11:00 AM Cottle, Steva Ready., MD [facility] None        Orders Placed This Encounter  Procedures   Lithium level   Basic metabolic panel   TSH     -------------------------------

## 2022-11-02 NOTE — Telephone Encounter (Signed)
Patient seen in the office today.

## 2022-11-03 ENCOUNTER — Ambulatory Visit: Payer: BC Managed Care – PPO | Admitting: Psychiatry

## 2022-11-12 ENCOUNTER — Encounter: Payer: Self-pay | Admitting: Psychiatry

## 2022-11-12 ENCOUNTER — Ambulatory Visit (INDEPENDENT_AMBULATORY_CARE_PROVIDER_SITE_OTHER): Payer: BC Managed Care – PPO | Admitting: Psychiatry

## 2022-11-12 DIAGNOSIS — F319 Bipolar disorder, unspecified: Secondary | ICD-10-CM

## 2022-11-12 DIAGNOSIS — R110 Nausea: Secondary | ICD-10-CM

## 2022-11-12 DIAGNOSIS — F902 Attention-deficit hyperactivity disorder, combined type: Secondary | ICD-10-CM | POA: Diagnosis not present

## 2022-11-12 DIAGNOSIS — F422 Mixed obsessional thoughts and acts: Secondary | ICD-10-CM | POA: Diagnosis not present

## 2022-11-12 DIAGNOSIS — F952 Tourette's disorder: Secondary | ICD-10-CM | POA: Diagnosis not present

## 2022-11-12 DIAGNOSIS — G901 Familial dysautonomia [Riley-Day]: Secondary | ICD-10-CM

## 2022-11-12 DIAGNOSIS — R1310 Dysphagia, unspecified: Secondary | ICD-10-CM

## 2022-11-12 DIAGNOSIS — R251 Tremor, unspecified: Secondary | ICD-10-CM

## 2022-11-12 DIAGNOSIS — Z79899 Other long term (current) drug therapy: Secondary | ICD-10-CM

## 2022-11-12 MED ORDER — ONDANSETRON 8 MG PO TBDP: 8.0000 mg | ORAL_TABLET | Freq: Three times a day (TID) | ORAL | 0 refills | Status: DC | PRN

## 2022-11-12 MED ORDER — MODAFINIL 200 MG PO TABS: ORAL_TABLET | ORAL | 1 refills | Status: DC

## 2022-11-12 NOTE — Progress Notes (Signed)
JACQULYNN BRETTSCHNEIDER [id number] Jul 14, 2000 22 y.o.   Subjective:   Patient ID:  AZELIE GUSTAVESON is a 22 y.o. (DOB 01-Jun-2001) female.  Chief Complaint:  Chief Complaint  Patient presents with   Follow-up   Depression   Anxiety   Manic Behavior    HPI PRAPTI FLOW presents today for follow-up of OCD, ADD, depression  02/20/20 appt with the following noted: Seen with mother [name] and father [name].  Rob needing psych help but gave up his appt for daughter to be seen before going to school. She's going to [place] to school. She has OCD, ADD, Tourette's and depression.  They wonder about other med changes. She needs liquids bc anxiety swallowing pills. Pt doesn't drive DT anxiety. Would like help with OCD.  Hesitant about ADD meds but poor tolerance with anxiety. Contamination fears with handwashing.  Germs in general and fear of vomtiing so hard to take pills. Handwashes time hard to say but a couple of minutes.  Irritating time.   Triggers include cleaning.   She will avoid hospitals and in general doctors offices. Mo concerned OCD takes a lot of time and attention. She recognizes OCD started in middle school. Parents in support of her going to school Depressed but not severe.  Not a direct consequence of OCD.  Feels exhausted and not motivated. Sleep about 9 hours.  Energy OK.  Not much to concentrate on.  Hard to read. ADD lack of focus is a problem on reading and is a concern for school.  Had racing heart before stimulants and they made it worse. Can get this with anxiety and get dizzy. Leaves for school 8/31. [name] first year.  In [place].    03/07/20 appt with the following noted: Abilify 5 definitely helped with depression. Dep 3/10.   More active.  Better productivity.  Better mood. Some difference with OCD.  Not as big improvement as with depression but better.  Not sure about time spent with OCD.  Anxiety generally 5/10/  Worst times 8/10.   Biggest concern  about OCD and school is the potential time involved.  Went time to do school work sometimes hard to resist OCD.  Not very concerned about OCD affecting her socially.   SE: Some hunger at times.  Restlessness fine now.   Plan: Continue Lexapro at current dose Increase Abilify to 7-8 mg now and if improved then no higher. If no improvement then increase to 10 mg daily.   06/25/2020 appointment with the following noted: Mostly good but not much improvement with OCD.  Depression changing like usual.  Weather affects her and other factors.  History of seasonal depression.  More tired and sleep more.  Annoying but not a huge deal.   Increase in Abilify without sig change.   Finishes exams Thursday and break for a month. No SE.   Able to enjoy things. Handwashing 5 min each time she washes them.  Hard to know how much total time. Tremor before Lexapro.    Plan: Continue Lexapro but increase to 35 mg daily for 2 weeks, then 40 mg daily. High dose medically necessary.  Needs liquid and couldn't tolerate the sertraline. After exams drop Abilify back to 5 mg bc not better with increase to 10 mg.  08/12/2020 telephone call with the following noted: Lavilla was taken off of Abilify and had her Lexapro increased. Since doing this she is feeling worse, more depressed and lack of energy. She would like  to know what she needs to do to help with this MD response: Her last appointment was 06/25/2020.  Prior to that appointment we had increased Abilify from 5 mg to 10 mg without additional improvement.  Therefore the instructions were to reduce the Abilify back to 5 mg daily.  She was not instructed to discontinue it. Because the increase in Abilify had not been helpful from 5 to 10 mg, in addition to reducing the Abilify back to 5 mg she was to increase the Lexapro from 30 mg to 40 mg. It is unlikely that increasing Lexapro would make her more depressed.  I think the depression is worse because she stopped the Abilify  apparently.  The increase in Lexapro could possibly make her more fatigued but worsening depression could be causing the fatigue.  The benefit from restarting Abilify 5 mg daily should be quick, that is benefit within a week. Therefore I suggest she continue the current dose of Lexapro and add 5 mg of Abilify back.  The depression should improve within a week or so.  If she is not better by February 10 then call us back  11/10/2020 appt noted: Stayed 1 semester at [employer] not a good fit and left.  Goiing to [employer] in the fall. Felt worse off the Abilify and restarted at 5 mg 08/12/20 and felt better energy. Doing pretty good, pretty busy.  Couple of classes at [employer] just to get some credit.s OCD is about the same without much change with increase in Lexapro.  No SE noted. Anxiety is fairly high usually associated with obsessive thoughts. Not too much depression. Sleep regular. NO SE.   Not regular Zofran but occ and not sure what causes the nausea. Plan: Reduce Lexapro to 20 ml and add 5 ml fluoxetine for 1 week, Then reduce Lexapro to 10 ml and increase fluoxetine to 10 ml for 1 week, Then stop Lexapro and increase fluoxetine to 15 ml daily  02/16/2021 appointment with the following noted:  Seen with DAD Switched to fluoxetine 15 ml and energy and motivation and mood is better.  Anxiety is a little better but not b much.  OCD is about the same. No problems with the switch in meds.   Hard to tell how much time OCD takes bc she can mutifunciton while does compulsions. Avoids contaminants and handwashes but can work at [employer]. No SE.   Not really depressed.   Takes Zofran intermittently a couple times per week can be triggered with caffeine. Father says she's seemed to be doing well and excited about school. Going to [employer] to [employer]. Less tremor. Plan: Option increase fluoxetine to 80 mg daily for OCD.  She'd like to try  it.  07/01/2021 appointment with the following noted: Didn't increase bc fluoxetine dose is the same. 60 mg daily. Been doing fairly well except for when off for several weeks.  When on it I feel good. No SE Don't like the quillivant XR bc triggers intrusive SI and when coming off has depression and SI.  Not had this with other stimulants.  Takes it rarely. Still on aripiprazole 5 ml daily. No other SE. Mildly depressed.   Can have a good portion of time with OCD but doesn't always interfere with other things. Still nausea and not sure why.  Not caused by meds. When off Prozac grades went down. Goes back to school Jan 11.  01/04/2022 appointment with the following noted:  Psychiatric hospitalization 11/06/2021 until 11/12/2021 at [facility] in [place] for depression with suicidal ideation after drinking alcohol with a bottle of cough syrup and use of marijuana as well as noncompliance with psychiatric medications. Prescribed Abilify 5 mg daily along with fluoxetine 40 mg daily She's at home. She doesn't remember much about how she was doing then.  She had stopped meds a month or 2 before hosp but not sure why.  Before hosp was a few drinks 4 nights a week.  With drew from 1 class and passed the other 2.  Was smoking marijuana not daily but soemtimes multiple times per day.  Sometimes missed classes from oversleeping.  No opiates.   I think I might be bipolar bc was careening back and forth emotionally. Didn't sleep last night. Done some research on bipolar 2 including hyperactivity, with bursts of creativity and not sleeping. Also hypertalkative and hypersexual and risk taking and grandiose thinking.  Upswings might last days.  Mixed states also. Distinct depressions might last weeks.  Not much normal mood. Been at beach with friend last week.  No comments from family or friend. F thinks she might be bipolar. Has compulsions but not anxious about it. Handwashing a lot less than in the past. No  panic and no avoidance. Had anxiety about classes bc wasn't prepared enough. Swallowing is better now. Nausea for years without pattern. Consistent with meds. Consistent with Abilify 5 and fluoxetine 80 Plan: She elects to increase Abilify to 10 mg daily.  01/22/22 appt noted: Increase Abilify 10 mg daily with SE mouth tics and reduced to 5 mg daily.  No one commented.  Moving around jaw would cause some pain soreness..  better on lower dose re: SE. Reduced just recently. Mood has been high and probably hypomanic or manic. Sometimes hears people knock on window or hears her name.  Weird. Staying up late and some EMA.  Avg 4-6 hours per night.  Some nights no sleep.   Mood and confidence and energy increase.  Always a bad spender, some increased risk taking behavior.  More talkative. Anxiety has been alright and not as bad as it was.   Nausea but no other sx physically. Plan: Trileptal and increase to 150 mg every morning and 300 mg nightly.   Reduced fluxoeine to 60 DT rapid cycling Reduced Abilify to 5 mg daily. Continue Vyvanse 30  02/12/2022 appointment with the following noted: Not taking Trilepotal, stopped Trileptal DT increase SI so stopped.  Took it a few days. Still having some SI.  Not all the time but not too strong bc no plan.  If sees knife will have the thought.   Off Abilify. Has been depressed since after vacation.  Low energy and mood, little motivation.  Neg self talk. No irritability or anger. Sleep too much 10+ hours.  No napping. Not doing much.   At the beach was feeling fine.   Main stress, boredom and loneliness. Planning to return to school and feels capable.  Benefit structure and friends. Not much anxiety or OCD. Mouth tics resolved. Persistent N and lost 10# bc hard to eat. Plan: Start Lithobid 300 mg nightly x 4 days then 600 mg nightly.  03/19/22 appt noted: Sharing apt. Good with room mates. Start Lithobid 300 mg nightly x 4 days then 600 mg nightly.   No effect noted. No SI Up and down with downs more noticeable.  Poor concept of time.  But days duration. With lows doesn't want to  do anything. Only missed 2 classes.  Lays around when depressed. Sometimes can tell some mania and high energy and want to do so much stuff.Marland Kitchen  around the end of last week lasting 4-5 days. Not suire about mood now, can't identify how I'm feeling. Seeing out people and don't want to be alone.  Wouldn't say I'm not depressed. Not much anxiety. OCD not bad at all Rare trazodone. Worked with some hangover. nNausea is about the same. No cause known and has appt with doctor. Mouth tics much better off Abilify Sleep irregular from 4-12 hours.  Sometimes don't feel like sleeping.    04/21/22 appt noted: Taking lithium 600 mg daily. Missed Vyvanse and N is better.  Did help with ADD. Sleep is better but more than she would like. Willing to try increasing the lithium. She wants help with ADD but has not tolerated stimulants very well.  She is keeping up with class work for the most part and is not missing classes generally.  She needs to sleep more than she would like about 10 hours.  No major mood swings.  No suicidal thoughts since she was last here.  Anxiety is manageable but she does feel somewhat stressed by her schoolwork. Plan: Still rec increase  Lithobid 900 mg nightly. Check lithium level.  09/22/22 appt noted: Made changes noted above.  So much better with change to daylight savings time.  But has had a lot of depression and some SI.  Really bad Nov-Dec .  Didn't go back to college.  Not good Jan & Feb.  March better with spring but not as good as she would like.  SI as recent as last week.  Can be triggered if feeling unwanted in social situations.  But can be triggered without reason.   Dep better on spring break with friends lately.  Overall dep 6-7/10 and anxiety 4/10 lately. No sig manic sx since here.   No sig SE.    Plan: Due to rapid cycling we have  reduced the fluoxetine to 40 mg daily. Took 60 for 7 weeks.   Increase fluoxetine to 60 mg daily for depression. Continue lihtium 900, modafinil 100-200 mg AM prn ADD sx., trazodone 50 mg HS prn  09/29/22 TC:  Patient was last seen on 3/13 and her fluoxetine dose was increased. She is reporting frequent mood changes, said she was probably hypomanic, now describing increased depression, having anger outbursts which she said is not like her. She reports intermittent awakening at night, but is able to go back to sleep.    She also had an issue with not being able to get her modafinil.     MD resp: We need to stop the mood swings as top priority bc the depression will not be controlled as long as having mood swings.  Lithium should be preventing the mood swings but is not and that may be due to blood level being too low.  I have requested her to get a blood level sometime ago but I have never received 1.  In order to speed the process up have her increase the lithium to 4 tablets daily and get a blood level in a week.  The day before the blood test take the lithium as normal and get the blood test approximately 12 hours after the last dose of lithium.  It is very important that she get that level so we can determine whether lithium is going to work for her or not.  But she needs to wait 5 to 7 days after increasing the lithium to 4 tablets daily before getting the blood level. Ill send lihtium level order to [facility].     10/25/22 TC:  Pt called 1:04pm today. [facility] and they took bloodwork on her but think she had lithium toxicity. She went to [employer] in Clinic. She was taking 1200mg  of lithium right now. She is not sure what to do and lab results haven't come back yet. (Shakiness, Unsteady, Dizzy, cloudy, Vision blurred, stomach upset , fatigued, body aches)    MD resp:  RTC  Lithium level 1.1.  stopped a couple of days ago.  Complaining of sx starting Thursday.    No change in sx or meds.  Was feeling fatigued.   Drinking fluids now.   Stopped lithium Saturday.   Stay off lithium for a couple of days and then call back when sx resolve.  No other changes.   No SI lately.    She agrees. Meredith Staggers, MD, DFAPA     11/02/22 appt noted: Stopped lithium on weekend.  Restarted at 300 mg daily. Feels a little shakey, nausea, mild dizzy feeling.  Mild tremor. No med changes or additions last week.  Eating and drinking normally. Over the last month mood is pretty good overall.   No SI in the last month. She has felt physically anxious and can't place why.  Not much OCD right now. More energy and focus in the day with modafinil. No NSAIDs. Lithium does seem to work and would like to continue it.   Had some tremor before lithium.  Uncertain if family hisotry of it. Plan; no changes  11/12/22 appt noted: Overall N is better and less Zofran. Propranolol prn anxiety Normal amount of tremor and nausea.  No longer unsteady.   Mentally ok.   Noel Christmas living with them and that has helped mood. Uncertain future.  Goal is to save up enough for apt.  But friend may be there a long while.  Met first year college of [employer]. Not lonely or bored all the time and those are the things that really get me.   No SI since here. Nothing terribly excessive anxiety but some residual OCD is the main source of the anxiety. No SE now.   Asks about increasing lithium.  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 Previous [place] [employer] without help.  GF OCD. F bipolar lithium with benefit.  Past Psychiatric Medication Trials: History of Tourette's, ADD, OCD History of ADD brief trials Adderall XR 15, guanfacine ER 1 tired, methylphenidate, Strattera Quillevent SI Vyvanse N Modafinil 100 tolerated. Lithium 900 tolerated; 1200 SE Lexapro 40, sertraline liquid brief hard to swallow,   Fluoxetine 60 better mood mirtazapine, Wellbutrin  Abilify 10 EPS "mouth tics" & no  better than 5 which helped. Trileptal brief increase SI  Psychiatric hospitalization 11/06/2021 until 11/12/2021 at [facility] in [place] for depression with suicidal ideation after drinking alcohol with a bottle of cough syrup and use of marijuana as well as noncompliance with psychiatric medications. Prescribed Abilify 5 mg daily along with fluoxetine 40 mg daily. Had EPS with increased Abilify to 10 mg daily.   Stopped DT "mouth tics".  Review of Systems:  Review of Systems  Cardiovascular:  Negative for palpitations.  Gastrointestinal:  Positive for nausea.  Neurological:  Positive for dizziness. Negative for tremors.  Psychiatric/Behavioral:  Negative for sleep disturbance.     Medications: I  have reviewed the patient's current medications.  Current Outpatient Medications  Medication Sig Dispense Refill   FLUoxetine (PROZAC) 20 MG capsule Take 3 capsules (60 mg total) by mouth daily. (Patient taking differently: Take 40 mg by mouth daily.) 270 capsule 0   lithium carbonate (LITHOBID) 300 MG ER tablet Take 3 tablets (900 mg total) by mouth daily at 12 noon. (Patient taking differently: Take 300 mg by mouth daily at 12 noon.) 270 tablet 0   propranolol (INDERAL) 10 MG tablet Take 1-2 tabs po BID prn anxiety or tremor 120 tablet 1   modafinil (PROVIGIL) 200 MG tablet 1 in the morning 30 tablet 1   ondansetron (ZOFRAN-ODT) 8 MG disintegrating tablet Take 1 tablet (8 mg total) by mouth every 8 (eight) hours as needed for nausea or vomiting. 20 tablet 0   traZODone (DESYREL) 50 MG tablet Take 1 tablet (50 mg total) by mouth at bedtime as needed for sleep. (Patient not taking: Reported on 11/12/2022) 30 tablet 0   No current facility-administered medications for this visit.    Medication Side Effects: None  Allergies: No Known Allergies  Past Medical History:  Diagnosis Date   Constipation    GERD (gastroesophageal reflux disease)    Granuloma of skin    on forehead    Family History   Problem Relation Age of Onset   GER disease Father    Ulcerative colitis Father    ADD / ADHD Father    Anxiety disorder Father    Depression Father    Bipolar disorder Father    Migraines Paternal Grandmother    Anxiety disorder Paternal Grandmother    Depression Paternal Grandmother    Seizures Neg Hx    Autism Neg Hx    Schizophrenia Neg Hx     Social History   Socioeconomic History   Marital status: Single    Spouse name: n/a   Number of children: 0   Years of education: Not on file   Highest education level: Not on file  Occupational History   Occupation: student  Tobacco Use   Smoking status: Never   Smokeless tobacco: Never  Vaping Use   Vaping Use: Never used  Substance and Sexual Activity   Alcohol use: No   Drug use: No   Sexual activity: Never  Other Topics Concern   Not on file  Social History Narrative   Lives with mom, dad, brother in the same house. She is in the 11th grade at [employer]. Does well in school. She enjoys listening to music, writing and singing.    Social Determinants of Health   Financial Resource Strain: Not on file  Food Insecurity: Not on file  Transportation Needs: Not on file  Physical Activity: Not on file  Stress: Not on file  Social Connections: Not on file  Intimate Partner Violence: Not on file    Past Medical History, Surgical history, Social history, and Family history were reviewed and updated as appropriate.   Please see review of systems for further details on the patient's review from today.   Objective:   Physical Exam:  There were no vitals taken for this visit.  Physical Exam Neurological:     Mental Status: She is alert and oriented to person, place, and time.     Cranial Nerves: No dysarthria.  Psychiatric:        Attention and Perception: Attention and perception normal.        Mood and Affect: Mood is anxious  and depressed.        Speech: Speech normal.        Behavior: Behavior is cooperative.         Thought Content: Thought content normal. Thought content is not paranoid or delusional. Thought content does not include homicidal or suicidal ideation. Thought content does not include suicidal plan.        Cognition and Memory: Cognition and memory normal.        Judgment: Judgment normal.     Comments: Insight intact Anxiety manageable  Depression better at moment. fidgety     Lab Review:  No results found for: "NA", "K", "CL", "CO2", "GLUCOSE", "BUN", "CREATININE", "CALCIUM", "PROT", "ALBUMIN", "AST", "ALT", "ALKPHOS", "BILITOT", "GFRNONAA", "GFRAA"  No results found for: "WBC", "RBC", "HGB", "HCT", "PLT", "MCV", "MCH", "MCHC", "RDW", "LYMPHSABS", "MONOABS", "EOSABS", "BASOSABS"  No results found for: "POCLITH", "LITHIUM"   No results found for: "PHENYTOIN", "PHENOBARB", "VALPROATE", "CBMZ"   Review [facility] testing dated September 30, 2017.  SLC 6 A4 was long long.  HDR2A was normal genotype. Pharmacokinetic genes included normal 2D6, 3 A4, 2 C9, 2C19, 1A2, and intermediate 2B 6  10/23/22 Lithium level 1.1. while taking 1200 mg day but had   .res Assessment: Plan:    Waldene was seen today for follow-up, depression, anxiety and manic behavior.  Diagnoses and all orders for this visit:  Bipolar affective disorder, rapid cycling (HCC)  Mixed obsessional thoughts and acts  Attention deficit hyperactivity disorder (ADHD), combined type, moderate -     modafinil (PROVIGIL) 200 MG tablet; 1 in the morning  Tourette's disorder  Dysautonomia (HCC)  Tremor, unspecified  Lithium use  Chronic nausea -     ondansetron (ZOFRAN-ODT) 8 MG disintegrating tablet; Take 1 tablet (8 mg total) by mouth every 8 (eight) hours as needed for nausea or vomiting.  Difficulty swallowing pills -     ondansetron (ZOFRAN-ODT) 8 MG disintegrating tablet; Take 1 tablet (8 mg total) by mouth every 8 (eight) hours as needed for nausea or vomiting.   Greater than 50% of 30 min face to face  time with patient was spent on counseling and coordination of care.   SX overall under fair control but she'd like to increase lithiumbc mood is not as good as it should be.  Discussed her concerns that she may have bipolar disorder type II, recent hypomania.  She has done some reading on the subject.  That appears to be accurate.  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  We discussed how the presence of SSRIs used for her OCD may increase the risk of rapid cycling.  However there is not a good alternative to SSRIs for OCD. Extensive discussion about alternative types of mood stabilizers including lithium, Depakote and atypical antipsychotics.  Lithium is not reliably effective for rapid cycling but father is bipolar with benefit from lithium.  We discussed the option of Depakote but not ideal in woman of child bearing age but we may have little other options DT apprent SE antipsychotic.  Because of "mouth tics" would like to avoid antipsychotics if possible. T  Due to the suicidal thoughts lithium would be a good choice.  Her father also responded to lithium.  Informed her we could go up in the dose if she starts developing manic symptoms.  We discussed again the importance of the mood stabilizer.  We will use Lithobid because of nausea problems. Increase   Lithobid 600 mg nightly and if needed back  to 900 mg nightly.  Counseled patient regarding potential benefits, risks, and side effects of lithium to include potential risk of lithium affecting thyroid and renal function.  Discussed need for periodic lab monitoring to determine drug level and to assess for potential adverse effects.  Counseled patient regarding signs and symptoms of lithium toxicity and advised that they notify office immediately or seek urgent medical attention if experiencing these signs and symptoms.  Patient advised to contact office with any questions or concerns. Recent sx lithium toxicity  resolved.  Due to rapid cycling we have reduced the fluoxetine to 40 mg daily. Took 60 for 7 weeks.   Increased fluoxetine to 60 mg daily for depression and she's cut back to 40 mg daily.  No change today      She has learned to swallow capsules while at the hospital.  We discussed the risk of worsening OCD at the lower dose  Consider Auvelity bc father trying it now.  Defer to see if fluoxetine works.answered questions about Spravato.    Coffee worsens tremor discussed and she's aware.  Modafinil off label DT poor tolerance stimulants, modafinil 100 mg AM.  Off label for ADD bc N with Vyvanse and poor mood tolerance. Tolerating this well. And it helped.   Discussed the importance of avoiding marijuana and alcohol abuse at this time  We discussed her father's psychiatric meds and the potential for use of lamotrigine.  We will we will defer this choice at this time in order to keep medications as simple as possible  vitamin D 4000-5000 units daily in winter.  Disc counseling  Consider lamotrigine for seasonal depression  Call if symptoms worsen or she has significant side effects.  FU 1 mos  Meredith Staggers, MD, DFAPA  Please see After Visit Summary for patient specific instructions.  Future Appointments  Date Time Provider Department Center  12/15/2022 10:30 AM Cottle, Steva Ready., MD [facility] None        No orders of the defined types were placed in this encounter.    -------------------------------

## 2022-11-15 ENCOUNTER — Telehealth: Payer: Self-pay | Admitting: Psychiatry

## 2022-11-15 NOTE — Telephone Encounter (Signed)
[facility] sent PA request for Modafinil 200mg   see CMM

## 2022-11-24 ENCOUNTER — Other Ambulatory Visit: Payer: Self-pay | Admitting: Psychiatry

## 2022-11-24 DIAGNOSIS — R251 Tremor, unspecified: Secondary | ICD-10-CM

## 2022-11-24 DIAGNOSIS — T56891A Toxic effect of other metals, accidental (unintentional), initial encounter: Secondary | ICD-10-CM

## 2022-12-02 DIAGNOSIS — N926 Irregular menstruation, unspecified: Secondary | ICD-10-CM | POA: Diagnosis not present

## 2022-12-15 ENCOUNTER — Ambulatory Visit: Payer: BC Managed Care – PPO | Admitting: Psychiatry

## 2022-12-16 ENCOUNTER — Ambulatory Visit (INDEPENDENT_AMBULATORY_CARE_PROVIDER_SITE_OTHER): Payer: Self-pay | Admitting: Psychiatry

## 2022-12-16 DIAGNOSIS — Z91199 Patient's noncompliance with other medical treatment and regimen due to unspecified reason: Secondary | ICD-10-CM

## 2022-12-16 NOTE — Progress Notes (Signed)
No show

## 2022-12-24 ENCOUNTER — Other Ambulatory Visit: Payer: Self-pay | Admitting: Psychiatry

## 2022-12-24 DIAGNOSIS — F422 Mixed obsessional thoughts and acts: Secondary | ICD-10-CM

## 2022-12-29 ENCOUNTER — Telehealth: Payer: Self-pay | Admitting: Psychiatry

## 2022-12-29 NOTE — Telephone Encounter (Signed)
Thank you for info.  She can increase fluoxetine back to 60 mg daily.   If mood swings result then let us know and we will need to change the plan.  Keep appt in July/

## 2022-12-29 NOTE — Telephone Encounter (Signed)
Kim [name] called at 11:14 to report that she is not seeing enough benefit from the Fluoxetine.  It was reduced do to side effects, but she would like to try to increase it a little bit to see if it helps.  Please call to discuss.  Appt 7.8

## 2022-12-29 NOTE — Telephone Encounter (Signed)
Patient reporting depression on average 7/10, anxiety 4/10. No new stressors.  Still taking 600 mg of lithium.

## 2022-12-29 NOTE — Telephone Encounter (Signed)
Please see message from patient in regards to increasing fluoxetine.   From last note:  Due to rapid cycling we have reduced the fluoxetine to 40 mg daily. Took 60 for 7 weeks.   Increased fluoxetine to 60 mg daily for depression and she's cut back to 40 mg daily.  No change today      She has learned to swallow capsules while at the hospital.  We discussed the risk of worsening OCD at the lower dose   Consider Auvelity bc father trying it now.  Defer to see if fluoxetine works.answered questions about Spravato.

## 2022-12-29 NOTE — Telephone Encounter (Signed)
Pls ask if her primary sx are dep or anxiety?  What is the level of each; mild , moderate, severe?   I am open to increasing it but it seemed to contribute to mood cycling before and I'm concerned it may happen again if she is still taking the low dose of lithium 600 mg daily.  Is she taking that dose or 900 mg now? Thanks.

## 2022-12-30 NOTE — Telephone Encounter (Signed)
LVM that I would send response via MyChart, which I did.

## 2023-01-09 ENCOUNTER — Other Ambulatory Visit: Payer: Self-pay | Admitting: Psychiatry

## 2023-01-09 DIAGNOSIS — R110 Nausea: Secondary | ICD-10-CM

## 2023-01-09 DIAGNOSIS — R1310 Dysphagia, unspecified: Secondary | ICD-10-CM

## 2023-01-11 DIAGNOSIS — Z3040 Encounter for surveillance of contraceptives, unspecified: Secondary | ICD-10-CM | POA: Diagnosis not present

## 2023-01-11 DIAGNOSIS — N644 Mastodynia: Secondary | ICD-10-CM | POA: Diagnosis not present

## 2023-01-17 ENCOUNTER — Ambulatory Visit (INDEPENDENT_AMBULATORY_CARE_PROVIDER_SITE_OTHER): Payer: Self-pay | Admitting: Psychiatry

## 2023-01-17 DIAGNOSIS — Z91199 Patient's noncompliance with other medical treatment and regimen due to unspecified reason: Secondary | ICD-10-CM

## 2023-01-17 NOTE — Progress Notes (Signed)
No show

## 2023-02-24 DIAGNOSIS — Z23 Encounter for immunization: Secondary | ICD-10-CM | POA: Diagnosis not present

## 2023-02-24 DIAGNOSIS — S71112A Laceration without foreign body, left thigh, initial encounter: Secondary | ICD-10-CM | POA: Diagnosis not present

## 2023-02-24 DIAGNOSIS — Z9152 Personal history of nonsuicidal self-harm: Secondary | ICD-10-CM | POA: Diagnosis not present

## 2023-02-24 DIAGNOSIS — X789XXA Intentional self-harm by unspecified sharp object, initial encounter: Secondary | ICD-10-CM | POA: Diagnosis not present

## 2023-02-24 DIAGNOSIS — Z7289 Other problems related to lifestyle: Secondary | ICD-10-CM | POA: Diagnosis not present

## 2023-02-24 DIAGNOSIS — X58XXXA Exposure to other specified factors, initial encounter: Secondary | ICD-10-CM | POA: Diagnosis not present

## 2023-02-24 DIAGNOSIS — S81812A Laceration without foreign body, left lower leg, initial encounter: Secondary | ICD-10-CM | POA: Diagnosis not present

## 2023-03-06 ENCOUNTER — Other Ambulatory Visit: Payer: Self-pay | Admitting: Psychiatry

## 2023-03-06 DIAGNOSIS — R1310 Dysphagia, unspecified: Secondary | ICD-10-CM

## 2023-03-06 DIAGNOSIS — Z4802 Encounter for removal of sutures: Secondary | ICD-10-CM | POA: Diagnosis not present

## 2023-03-06 DIAGNOSIS — R110 Nausea: Secondary | ICD-10-CM

## 2023-03-09 ENCOUNTER — Other Ambulatory Visit: Payer: Self-pay | Admitting: Psychiatry

## 2023-03-09 DIAGNOSIS — F319 Bipolar disorder, unspecified: Secondary | ICD-10-CM

## 2023-03-16 DIAGNOSIS — N921 Excessive and frequent menstruation with irregular cycle: Secondary | ICD-10-CM | POA: Diagnosis not present

## 2023-03-16 DIAGNOSIS — Z113 Encounter for screening for infections with a predominantly sexual mode of transmission: Secondary | ICD-10-CM | POA: Diagnosis not present

## 2023-03-18 ENCOUNTER — Other Ambulatory Visit: Payer: Self-pay | Admitting: Psychiatry

## 2023-03-18 DIAGNOSIS — F319 Bipolar disorder, unspecified: Secondary | ICD-10-CM

## 2023-03-18 DIAGNOSIS — F422 Mixed obsessional thoughts and acts: Secondary | ICD-10-CM

## 2023-03-18 NOTE — Telephone Encounter (Signed)
Missed last 2 appts

## 2023-03-24 ENCOUNTER — Ambulatory Visit (INDEPENDENT_AMBULATORY_CARE_PROVIDER_SITE_OTHER): Payer: BC Managed Care – PPO | Admitting: Psychiatry

## 2023-03-24 ENCOUNTER — Encounter: Payer: Self-pay | Admitting: Psychiatry

## 2023-03-24 DIAGNOSIS — F319 Bipolar disorder, unspecified: Secondary | ICD-10-CM | POA: Diagnosis not present

## 2023-03-24 DIAGNOSIS — Z79899 Other long term (current) drug therapy: Secondary | ICD-10-CM

## 2023-03-24 DIAGNOSIS — F902 Attention-deficit hyperactivity disorder, combined type: Secondary | ICD-10-CM | POA: Diagnosis not present

## 2023-03-24 DIAGNOSIS — F952 Tourette's disorder: Secondary | ICD-10-CM

## 2023-03-24 DIAGNOSIS — F422 Mixed obsessional thoughts and acts: Secondary | ICD-10-CM | POA: Diagnosis not present

## 2023-03-24 MED ORDER — BUPROPION HCL ER (XL) 150 MG PO TB24
ORAL_TABLET | ORAL | 0 refills | Status: DC
Start: 2023-03-24 — End: 2023-07-25

## 2023-03-24 NOTE — Progress Notes (Signed)
Kim [name] [id number] 05-21-2001 22 y.o.   Subjective:   Patient ID:  Kim [name] is a 22 y.o. (DOB 03/23/2001) female.  Chief Complaint:  Chief Complaint  Patient presents with   Follow-up    Mood and anxiety and ADD    HPI Kim [name] presents today for follow-up of OCD, ADD, depression  02/20/20 appt with the following noted: Seen with mother Kim [name] and father Kim [name].  Rob needing psych help but gave up his appt for daughter to be seen before going to school. She's going to [place] to school. She has OCD, ADD, Tourette's and depression.  They wonder about other med changes. She needs liquids bc anxiety swallowing pills. Pt doesn't drive DT anxiety. Would like help with OCD.  Hesitant about ADD meds but poor tolerance with anxiety. Contamination fears with handwashing.  Germs in general and fear of vomtiing so hard to take pills. Handwashes time hard to say but a couple of minutes.  Irritating time.   Triggers include cleaning.   She will avoid hospitals and in general doctors offices. Mo concerned OCD takes a lot of time and attention. She recognizes OCD started in middle school. Parents in support of her going to school Depressed but not severe.  Not a direct consequence of OCD.  Feels exhausted and not motivated. Sleep about 9 hours.  Energy OK.  Not much to concentrate on.  Hard to read. ADD lack of focus is a problem on reading and is a concern for school.  Had racing heart before stimulants and they made it worse. Can get this with anxiety and get dizzy. Leaves for school 8/31. [name] first year.  In [place].    03/07/20 appt with the following noted: Abilify 5 definitely helped with depression. Dep 3/10.   More active.  Better productivity.  Better mood. Some difference with OCD.  Not as big improvement as with depression but better.  Not sure about time spent with OCD.  Anxiety generally 5/10/  Worst times 8/10.   Biggest concern about OCD and  school is the potential time involved.  Went time to do school work sometimes hard to resist OCD.  Not very concerned about OCD affecting her socially.   SE: Some hunger at times.  Restlessness fine now.   Plan: Continue Lexapro at current dose Increase Abilify to 7-8 mg now and if improved then no higher. If no improvement then increase to 10 mg daily.   06/25/2020 appointment with the following noted: Mostly good but not much improvement with OCD.  Depression changing like usual.  Weather affects her and other factors.  History of seasonal depression.  More tired and sleep more.  Annoying but not a huge deal.   Increase in Abilify without sig change.   Finishes exams Thursday and break for a month. No SE.   Able to enjoy things. Handwashing 5 min each time she washes them.  Hard to know how much total time. Tremor before Lexapro.    Plan: Continue Lexapro but increase to 35 mg daily for 2 weeks, then 40 mg daily. High dose medically necessary.  Needs liquid and couldn't tolerate the sertraline. After exams drop Abilify back to 5 mg bc not better with increase to 10 mg.  08/12/2020 telephone call with the following noted: [name] was taken off of Abilify and had her Lexapro increased. Since doing this she is feeling worse, more depressed and lack of energy. She would like to know  what she needs to do to help with this MD response: Her last appointment was 06/25/2020.  Prior to that appointment we had increased Abilify from 5 mg to 10 mg without additional improvement.  Therefore the instructions were to reduce the Abilify back to 5 mg daily.  She was not instructed to discontinue it. Because the increase in Abilify had not been helpful from 5 to 10 mg, in addition to reducing the Abilify back to 5 mg she was to increase the Lexapro from 30 mg to 40 mg. It is unlikely that increasing Lexapro would make her more depressed.  I think the depression is worse because she stopped the Abilify apparently.   The increase in Lexapro could possibly make her more fatigued but worsening depression could be causing the fatigue.  The benefit from restarting Abilify 5 mg daily should be quick, that is benefit within a week. Therefore I suggest she continue the current dose of Lexapro and add 5 mg of Abilify back.  The depression should improve within a week or so.  If she is not better by February 10 then call us back  11/10/2020 appt noted: Stayed 1 semester at [employer] not a good fit and left.  Goiing to [employer] in the fall. Felt worse off the Abilify and restarted at 5 mg 08/12/20 and felt better energy. Doing pretty good, pretty busy.  Couple of classes at [employer] just to get some credit.s OCD is about the same without much change with increase in Lexapro.  No SE noted. Anxiety is fairly high usually associated with obsessive thoughts. Not too much depression. Sleep regular. NO SE.   Not regular Zofran but occ and not sure what causes the nausea. Plan: Reduce Lexapro to 20 ml and add 5 ml fluoxetine for 1 week, Then reduce Lexapro to 10 ml and increase fluoxetine to 10 ml for 1 week, Then stop Lexapro and increase fluoxetine to 15 ml daily  02/16/2021 appointment with the following noted:  Seen with DAD Switched to fluoxetine 15 ml and energy and motivation and mood is better.  Anxiety is a little better but not b much.  OCD is about the same. No problems with the switch in meds.   Hard to tell how much time OCD takes bc she can mutifunciton while does compulsions. Avoids contaminants and handwashes but can work at [employer]. No SE.   Not really depressed.   Takes Zofran intermittently a couple times per week can be triggered with caffeine. Father says she's seemed to be doing well and excited about school. Going to [employer] to [employer]. Less tremor. Plan: Option increase fluoxetine to 80 mg daily for OCD.  She'd like to try it.  07/01/2021 appointment  with the following noted: Didn't increase bc fluoxetine dose is the same. 60 mg daily. Been doing fairly well except for when off for several weeks.  When on it I feel good. No SE Don't like the quillivant XR bc triggers intrusive SI and when coming off has depression and SI.  Not had this with other stimulants.  Takes it rarely. Still on aripiprazole 5 ml daily. No other SE. Mildly depressed.   Can have a good portion of time with OCD but doesn't always interfere with other things. Still nausea and not sure why.  Not caused by meds. When off Prozac grades went down. Goes back to school Jan 11.  01/04/2022 appointment with the following noted: Psychiatric hospitalization  11/06/2021 until 11/12/2021 at [facility] in [place] for depression with suicidal ideation after drinking alcohol with a bottle of cough syrup and use of marijuana as well as noncompliance with psychiatric medications. Prescribed Abilify 5 mg daily along with fluoxetine 40 mg daily She's at home. She doesn't remember much about how she was doing then.  She had stopped meds a month or 2 before hosp but not sure why.  Before hosp was a few drinks 4 nights a week.  With drew from 1 class and passed the other 2.  Was smoking marijuana not daily but soemtimes multiple times per day.  Sometimes missed classes from oversleeping.  No opiates.   I think I might be bipolar bc was careening back and forth emotionally. Didn't sleep last night. Done some research on bipolar 2 including hyperactivity, with bursts of creativity and not sleeping. Also hypertalkative and hypersexual and risk taking and grandiose thinking.  Upswings might last days.  Mixed states also. Distinct depressions might last weeks.  Not much normal mood. Been at beach with friend last week.  No comments from family or friend. F thinks she might be bipolar. Has compulsions but not anxious about it. Handwashing a lot less than in the past. No panic and no avoidance. Had  anxiety about classes bc wasn't prepared enough. Swallowing is better now. Nausea for years without pattern. Consistent with meds. Consistent with Abilify 5 and fluoxetine 80 Plan: She elects to increase Abilify to 10 mg daily.  01/22/22 appt noted: Increase Abilify 10 mg daily with SE mouth tics and reduced to 5 mg daily.  No one commented.  Moving around jaw would cause some pain soreness..  better on lower dose re: SE. Reduced just recently. Mood has been high and probably hypomanic or manic. Sometimes hears people knock on window or hears her name.  Weird. Staying up late and some EMA.  Avg 4-6 hours per night.  Some nights no sleep.   Mood and confidence and energy increase.  Always a bad spender, some increased risk taking behavior.  More talkative. Anxiety has been alright and not as bad as it was.   Nausea but no other sx physically. Plan: Trileptal and increase to 150 mg every morning and 300 mg nightly.   Reduced fluxoeine to 60 DT rapid cycling Reduced Abilify to 5 mg daily. Continue Vyvanse 30  02/12/2022 appointment with the following noted: Not taking Trilepotal, stopped Trileptal DT increase SI so stopped.  Took it a few days. Still having some SI.  Not all the time but not too strong bc no plan.  If sees knife will have the thought.   Off Abilify. Has been depressed since after vacation.  Low energy and mood, little motivation.  Neg self talk. No irritability or anger. Sleep too much 10+ hours.  No napping. Not doing much.   At the beach was feeling fine.   Main stress, boredom and loneliness. Planning to return to school and feels capable.  Benefit structure and friends. Not much anxiety or OCD. Mouth tics resolved. Persistent N and lost 10# bc hard to eat. Plan: Start Lithobid 300 mg nightly x 4 days then 600 mg nightly.  03/19/22 appt noted: Sharing apt. Good with room mates. Start Lithobid 300 mg nightly x 4 days then 600 mg nightly.  No effect noted. No SI Up  and down with downs more noticeable.  Poor concept of time.  But days duration. With lows doesn't want to do anything.  Only missed 2 classes.  Lays around when depressed. Sometimes can tell some mania and high energy and want to do so much stuff.Marland Kitchen  around the end of last week lasting 4-5 days. Not suire about mood now, can't identify how I'm feeling. Seeing out people and don't want to be alone.  Wouldn't say I'm not depressed. Not much anxiety. OCD not bad at all Rare trazodone. Worked with some hangover. nNausea is about the same. No cause known and has appt with doctor. Mouth tics much better off Abilify Sleep irregular from 4-12 hours.  Sometimes don't feel like sleeping.    04/21/22 appt noted: Taking lithium 600 mg daily. Missed Vyvanse and N is better.  Did help with ADD. Sleep is better but more than she would like. Willing to try increasing the lithium. She wants help with ADD but has not tolerated stimulants very well.  She is keeping up with class work for the most part and is not missing classes generally.  She needs to sleep more than she would like about 10 hours.  No major mood swings.  No suicidal thoughts since she was last here.  Anxiety is manageable but she does feel somewhat stressed by her schoolwork. Plan: Still rec increase  Lithobid 900 mg nightly. Check lithium level.  09/22/22 appt noted: Made changes noted above.  So much better with change to daylight savings time.  But has had a lot of depression and some SI.  Really bad Nov-Dec .  Didn't go back to college.  Not good Jan & Feb.  March better with spring but not as good as she would like.  SI as recent as last week.  Can be triggered if feeling unwanted in social situations.  But can be triggered without reason.   Dep better on spring break with friends lately.  Overall dep 6-7/10 and anxiety 4/10 lately. No sig manic sx since here.   No sig SE.    Plan: Due to rapid cycling we have reduced the fluoxetine to 40 mg  daily. Took 60 for 7 weeks.   Increase fluoxetine to 60 mg daily for depression. Continue lihtium 900, modafinil 100-200 mg AM prn ADD sx., trazodone 50 mg HS prn  09/29/22 TC:  Patient was last seen on 3/13 and her fluoxetine dose was increased. She is reporting frequent mood changes, said she was probably hypomanic, now describing increased depression, having anger outbursts which she said is not like her. She reports intermittent awakening at night, but is able to go back to sleep.    She also had an issue with not being able to get her modafinil.     MD resp: We need to stop the mood swings as top priority bc the depression will not be controlled as long as having mood swings.  Lithium should be preventing the mood swings but is not and that may be due to blood level being too low.  I have requested her to get a blood level sometime ago but I have never received 1.  In order to speed the process up have her increase the lithium to 4 tablets daily and get a blood level in a week.  The day before the blood test take the lithium as normal and get the blood test approximately 12 hours after the last dose of lithium.  It is very important that she get that level so we can determine whether lithium is going to work for her or not.  But she  needs to wait 5 to 7 days after increasing the lithium to 4 tablets daily before getting the blood level. Ill send lihtium level order to [facility].     10/25/22 TC:  Pt called 1:04pm today. [facility] and they took bloodwork on her but think she had lithium toxicity. She went to [employer] in Clinic. She was taking 1200mg  of lithium right now. She is not sure what to do and lab results haven't come back yet. (Shakiness, Unsteady, Dizzy, cloudy, Vision blurred, stomach upset , fatigued, body aches)    MD resp:  RTC  Lithium level 1.1.  stopped a couple of days ago.  Complaining of sx starting Thursday.   No change in sx or meds.  Was  feeling fatigued.   Drinking fluids now.   Stopped lithium Saturday.   Stay off lithium for a couple of days and then call back when sx resolve.  No other changes.   No SI lately.    She agrees. Meredith Staggers, MD, DFAPA     11/02/22 appt noted: Stopped lithium on weekend.  Restarted at 300 mg daily. Feels a little shakey, nausea, mild dizzy feeling.  Mild tremor. No med changes or additions last week.  Eating and drinking normally. Over the last month mood is pretty good overall.   No SI in the last month. She has felt physically anxious and can't place why.  Not much OCD right now. More energy and focus in the day with modafinil. No NSAIDs. Lithium does seem to work and would like to continue it.   Had some tremor before lithium.  Uncertain if family hisotry of it. Plan; no changes  11/12/22 appt noted: Overall N is better and less Zofran. Propranolol prn anxiety Normal amount of tremor and nausea.  No longer unsteady.   Mentally ok.   Noel Christmas living with them and that has helped mood. Uncertain future.  Goal is to save up enough for apt.  But friend may be there a long while.  Met first year college of [employer]. Not lonely or bored all the time and those are the things that really get me.   No SI since here. Nothing terribly excessive anxiety but some residual OCD is the main source of the anxiety. No SE now.   Asks about increasing lithium. Plan: Modafinil off label DT poor tolerance stimulants, modafinil 100 mg AM.    03/24/23 appt noted:  goes by [facility] since here Meds: fluoxetine 60 mg for couple mos, modafinil 200 mg AM, lithobid 600 Modafinil helped focus and attention.   No SE Increased fluoxetine to 60. Not bad mood but disconnected. No usual stress.   At home.  Starting new job FT early October.  Excited. Cut leg since here and had to get stitches middle August.  Dont' remember how she was feeling at the time.  No SI.  No sig SI since here.  Losing  time and can't recall wht she did yesterday.  Alcohol 3-4 days per week 2-3 beers and on weekend more.  Occ THC. No other drugs.   No NM.  Weird dreams and thinks their repetitive.  Sleep about 9 hours.   Not much obsessive thinking. No sig trauma history.  Not much other anxiety.   N is better.  No current Therapist Anne Shutter, Journalist, newspaper. Previous Eliott Nine without help.  GF OCD. F bipolar lithium with benefit.  Past Psychiatric Medication Trials: History  of Tourette's, ADD, OCD History of ADD brief trials Adderall XR 15, guanfacine ER 1 tired, methylphenidate, Strattera Quillevent SI Vyvanse N Modafinil 100 tolerated. Lithium 900 tolerated; 1200 SE Lexapro 40, sertraline liquid brief hard to swallow,   Fluoxetine 60 better mood Wellbutrin mirtazapine,   Abilify 10 EPS "mouth tics" & no better than 5 which helped. Trileptal brief increase SI  Psychiatric hospitalization 11/06/2021 until 11/12/2021 at [facility] in [place] for depression with suicidal ideation after drinking alcohol with a bottle of cough syrup and use of marijuana as well as noncompliance with psychiatric medications. Prescribed Abilify 5 mg daily along with fluoxetine 40 mg daily. Had EPS with increased Abilify to 10 mg daily.   Stopped DT "mouth tics".  Review of Systems:  Review of Systems  Cardiovascular:  Negative for palpitations.  Gastrointestinal:  Negative for nausea.  Neurological:  Negative for tremors and weakness.  Psychiatric/Behavioral:  Negative for sleep disturbance.     Medications: I have reviewed the patient's current medications.  Current Outpatient Medications  Medication Sig Dispense Refill   buPROPion (WELLBUTRIN XL) 150 MG 24 hr tablet 1 in the Am and then 2 in the AM 30 tablet 0   FLUoxetine (PROZAC) 20 MG capsule Take 2 capsules (40 mg total) by mouth daily. (Patient taking differently: Take 60 mg by mouth daily.) 60 capsule 0   lithium carbonate (LITHOBID) 300 MG ER  tablet TAKE 3 TABLETS BY MOUTH EVERY DAY AT 12 NOON (Patient taking differently: Take 600 mg by mouth daily.) 45 tablet 0   modafinil (PROVIGIL) 200 MG tablet 1 in the morning (Patient taking differently: Take 100 mg by mouth daily. 1 in the morning) 30 tablet 1   ondansetron (ZOFRAN-ODT) 8 MG disintegrating tablet TAKE 1 TABLET BY MOUTH EVERY 8 HOURS AS NEEDED FOR NAUSEA AND VOMITING 20 tablet 0   propranolol (INDERAL) 10 MG tablet TAKE 1-2 TABLETS TWICE DAILY AS NEEDED FOR ANXIETY OR TREMOR 360 tablet 1   traZODone (DESYREL) 50 MG tablet Take 1 tablet (50 mg total) by mouth at bedtime as needed for sleep. (Patient not taking: Reported on 03/24/2023) 30 tablet 0   No current facility-administered medications for this visit.    Medication Side Effects: None  Allergies: No Known Allergies  Past Medical History:  Diagnosis Date   Constipation    GERD (gastroesophageal reflux disease)    Granuloma of skin    on forehead    Family History  Problem Relation Age of Onset   GER disease Father    Ulcerative colitis Father    ADD / ADHD Father    Anxiety disorder Father    Depression Father    Bipolar disorder Father    Migraines Paternal Grandmother    Anxiety disorder Paternal Grandmother    Depression Paternal Grandmother    Seizures Neg Hx    Autism Neg Hx    Schizophrenia Neg Hx     Social History   Socioeconomic History   Marital status: Single    Spouse name: n/a   Number of children: 0   Years of education: Not on file   Highest education level: Not on file  Occupational History   Occupation: student  Tobacco Use   Smoking status: Never   Smokeless tobacco: Never  Vaping Use   Vaping status: Never Used  Substance and Sexual Activity   Alcohol use: No   Drug use: No   Sexual activity: Never  Other Topics Concern   Not on  file  Social History Narrative   Lives with mom, dad, brother in the same house. She is in the 11th grade at [employer] Does well in school.  She enjoys listening to music, writing and singing.    Social Determinants of Health   Financial Resource Strain: Not on file  Food Insecurity: Not on file  Transportation Needs: Not on file  Physical Activity: Not on file  Stress: Not on file  Social Connections: Unknown (02/24/2023)   Received from [facility]   Social Network    Social Network: Not on file  Intimate Partner Violence: Not At Risk (02/24/2023)   Received from [facility]   HITS    Over the last 12 months how often did your partner physically hurt you?: 1    Over the last 12 months how often did your partner insult you or talk down to you?: 1    Over the last 12 months how often did your partner threaten you with physical harm?: 1    Over the last 12 months how often did your partner scream or curse at you?: 1    Past Medical History, Surgical history, Social history, and Family history were reviewed and updated as appropriate.   Please see review of systems for further details on the patient's review from today.   Objective:   Physical Exam:  There were no vitals taken for this visit.  Physical Exam Constitutional:      General: She is not in acute distress.    Appearance: She is well-developed.  Musculoskeletal:        General: No deformity.  Neurological:     Mental Status: She is alert and oriented to person, place, and time.     Cranial Nerves: No dysarthria.     Coordination: Coordination normal.  Psychiatric:        Attention and Perception: Attention and perception normal.        Mood and Affect: Mood is anxious and depressed. Affect is not labile, blunt, angry or inappropriate.        Speech: Speech normal.        Behavior: Behavior normal. Behavior is cooperative.        Thought Content: Thought content normal. Thought content is not paranoid or delusional. Thought content does not include homicidal or suicidal ideation. Thought content does not include suicidal plan.        Cognition and  Memory: Cognition, memory and cognition and memory normal.        Judgment: Judgment normal.     Comments: Insight intact Anxiety manageable  Depression better at moment but anhedonia fidgety     Lab Review:  No results found for: "NA", "K", "CL", "CO2", "GLUCOSE", "BUN", "CREATININE", "CALCIUM", "PROT", "ALBUMIN", "AST", "ALT", "ALKPHOS", "BILITOT", "GFRNONAA", "GFRAA"  No results found for: "WBC", "RBC", "HGB", "HCT", "PLT", "MCV", "MCH", "MCHC", "RDW", "LYMPHSABS", "MONOABS", "EOSABS", "BASOSABS"  No results found for: "POCLITH", "LITHIUM"   No results found for: "PHENYTOIN", "PHENOBARB", "VALPROATE", "CBMZ"   Review [facility] testing dated September 30, 2017.  SLC 6 A4 was long long.  HDR2A was normal genotype. Pharmacokinetic genes included normal 2D6, 3 A4, 2 C9, 2C19, 1A2, and intermediate 2B 6  10/23/22 Lithium level 1.1. while taking 1200 mg day but had   .res Assessment: Plan:    Paiton "Jude" was seen today for follow-up.  Diagnoses and all orders for this visit:  Bipolar affective disorder, rapid cycling (HCC)  Mixed obsessional thoughts and acts  Attention deficit hyperactivity disorder (ADHD), combined type, moderate -     buPROPion (WELLBUTRIN XL) 150 MG 24 hr tablet; 1 in the Am and then 2 in the AM  Tourette's disorder  Lithium use    30 min face to face time with patient was spent on counseling and coordination of care.   SX overall under fair control but she'd like to increase lithiumbc mood is not as good as it should be.  Discussed her concerns that she may have bipolar disorder type II, recent hypomania.  She has done some reading on the subject.  That appears to be accurate.  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  We discussed how the presence of SSRIs used for her OCD may increase the risk of rapid cycling.  However there is not a good alternative to SSRIs for OCD. Extensive discussion about alternative types of mood  stabilizers including lithium, Depakote and atypical antipsychotics.  Lithium is not reliably effective for rapid cycling but father is bipolar with benefit from lithium.  We discussed the option of Depakote but not ideal in woman of child bearing age but we may have little other options DT apprent SE antipsychotic.  Because of "mouth tics" would like to avoid antipsychotics if possible. T  Due to the suicidal thoughts lithium would be a good choice.  Her father also responded to lithium.  Informed her we could go up in the dose if she starts developing manic symptoms.  We discussed again the importance of the mood stabilizer.  We will use Lithobid because of nausea problems. Increase   Lithobid 600 mg nightly and if needed back to 900 mg nightly.  Counseled patient regarding potential benefits, risks, and side effects of lithium to include potential risk of lithium affecting thyroid and renal function.  Discussed need for periodic lab monitoring to determine drug level and to assess for potential adverse effects.  Counseled patient regarding signs and symptoms of lithium toxicity and advised that they notify office immediately or seek urgent medical attention if experiencing these signs and symptoms.  Patient advised to contact office with any questions or concerns. Recent sx lithium toxicity resolved.  For mood she Increased fluoxetine to 60 mg daily for depression and she's continued it.      She has learned to swallow capsules while at the hospital.  We discussed the risk of worsening OCD at the lower dose  Consider Auvelity bc father trying it now.  Defer to see if fluoxetine works.answered questions about Spravato.    Coffee worsens tremor discussed and she's aware.  Modafinil off label DT poor tolerance stimulants, modafinil 100 mg AM.  Off label for ADD bc N with Vyvanse and poor mood tolerance. Tolerating this well. And it helped.   Retry Wellbutrin XL 150 mg AM and 300 mg AM for ADD and  cognitive complaints.  Discussed the importance of avoiding marijuana and alcohol abuse at this time  We discussed her father's psychiatric meds and the potential for use of lamotrigine.  We will we will defer this choice at this time in order to keep medications as simple as possible  vitamin D 4000-5000 units daily in winter.  Disc counseling  Consider lamotrigine for seasonal depression  Call if symptoms worsen or she has significant side effects.  FU 1 mos  Meredith Staggers, MD, DFAPA  Please see After Visit Summary for patient specific instructions.  Future Appointments  Date Time Provider Department Center  05/05/2023  2:30 PM Cottle, Steva Ready., MD [facility] None         No orders of the defined types were placed in this encounter.    -------------------------------

## 2023-04-07 ENCOUNTER — Other Ambulatory Visit: Payer: Self-pay | Admitting: Psychiatry

## 2023-04-07 DIAGNOSIS — F902 Attention-deficit hyperactivity disorder, combined type: Secondary | ICD-10-CM

## 2023-04-08 ENCOUNTER — Other Ambulatory Visit: Payer: Self-pay | Admitting: Psychiatry

## 2023-04-08 DIAGNOSIS — F422 Mixed obsessional thoughts and acts: Secondary | ICD-10-CM

## 2023-04-08 NOTE — Telephone Encounter (Signed)
Per note 03/24/23 says she increased her dose to 60mg  of  Fluoxetine.  Do we need to change the dosage, or the directions to 3 capsules daily. Also, I'm confused, its requesting a 90 day fill but it was written for 90 days.

## 2023-04-17 NOTE — Telephone Encounter (Signed)
Uses [facility]

## 2023-04-23 DIAGNOSIS — J069 Acute upper respiratory infection, unspecified: Secondary | ICD-10-CM | POA: Diagnosis not present

## 2023-04-28 DIAGNOSIS — N921 Excessive and frequent menstruation with irregular cycle: Secondary | ICD-10-CM | POA: Diagnosis not present

## 2023-05-03 ENCOUNTER — Other Ambulatory Visit: Payer: Self-pay | Admitting: Psychiatry

## 2023-05-03 DIAGNOSIS — F422 Mixed obsessional thoughts and acts: Secondary | ICD-10-CM

## 2023-05-03 NOTE — Telephone Encounter (Signed)
NV 10/24; Added DX F42.2

## 2023-05-03 NOTE — Telephone Encounter (Signed)
Has appt 10/24.

## 2023-05-05 ENCOUNTER — Ambulatory Visit: Payer: BC Managed Care – PPO | Admitting: Psychiatry

## 2023-05-08 ENCOUNTER — Other Ambulatory Visit: Payer: Self-pay | Admitting: Psychiatry

## 2023-05-08 DIAGNOSIS — F902 Attention-deficit hyperactivity disorder, combined type: Secondary | ICD-10-CM

## 2023-05-09 NOTE — Telephone Encounter (Signed)
Please schedule pt appt.  

## 2023-05-11 NOTE — Telephone Encounter (Signed)
Lvm for pt to call and schedule

## 2023-05-28 ENCOUNTER — Other Ambulatory Visit: Payer: Self-pay | Admitting: Psychiatry

## 2023-05-28 DIAGNOSIS — R251 Tremor, unspecified: Secondary | ICD-10-CM

## 2023-05-28 DIAGNOSIS — T56891A Toxic effect of other metals, accidental (unintentional), initial encounter: Secondary | ICD-10-CM

## 2023-07-06 ENCOUNTER — Other Ambulatory Visit: Payer: Self-pay | Admitting: Psychiatry

## 2023-07-06 DIAGNOSIS — R251 Tremor, unspecified: Secondary | ICD-10-CM

## 2023-07-06 DIAGNOSIS — T56891A Toxic effect of other metals, accidental (unintentional), initial encounter: Secondary | ICD-10-CM

## 2023-07-25 ENCOUNTER — Ambulatory Visit: Payer: BC Managed Care – PPO | Admitting: Psychiatry

## 2023-07-25 ENCOUNTER — Encounter: Payer: Self-pay | Admitting: Psychiatry

## 2023-07-25 DIAGNOSIS — F902 Attention-deficit hyperactivity disorder, combined type: Secondary | ICD-10-CM

## 2023-07-25 DIAGNOSIS — Z79899 Other long term (current) drug therapy: Secondary | ICD-10-CM

## 2023-07-25 DIAGNOSIS — F952 Tourette's disorder: Secondary | ICD-10-CM

## 2023-07-25 DIAGNOSIS — F422 Mixed obsessional thoughts and acts: Secondary | ICD-10-CM

## 2023-07-25 DIAGNOSIS — F319 Bipolar disorder, unspecified: Secondary | ICD-10-CM | POA: Diagnosis not present

## 2023-07-25 MED ORDER — CARIPRAZINE HCL 1.5 MG PO CAPS: 1.5000 mg | ORAL_CAPSULE | Freq: Every day | ORAL | Status: DC

## 2023-07-25 MED ORDER — MODAFINIL 200 MG PO TABS: ORAL_TABLET | ORAL | 1 refills | Status: DC

## 2023-07-25 NOTE — Progress Notes (Signed)
 Kim [name] [id number] Apr 20, 2001 23 y.o.   Subjective:   Patient ID:  Kim [name] is a 23 y.o. (DOB Jul 25, 2000) female.  Chief Complaint:  Chief Complaint  Patient presents with   Follow-up   Depression   Anxiety   ADD   Sleeping Problem    HPI Kim [name] presents today for follow-up of OCD, ADD, depression  02/20/20 appt with the following noted: Seen with mother Kim [name] and father Kim [name].  Rob needing psych help but gave up his appt for daughter to be seen before going to school. She's going to [place]  to school. She has OCD, ADD, Tourette's and depression.  They wonder about other med changes. She needs liquids bc anxiety swallowing pills. Pt doesn't drive DT anxiety. Would like help with OCD.  Hesitant about ADD meds but poor tolerance with anxiety. Contamination fears with handwashing.  Germs in general and fear of vomtiing so hard to take pills. Handwashes time hard to say but a couple of minutes.  Irritating time.   Triggers include cleaning.   She will avoid hospitals and in general doctors offices. Mo concerned OCD takes a lot of time and attention. She recognizes OCD started in middle school. Parents in support of her going to school Depressed but not severe.  Not a direct consequence of OCD.  Feels exhausted and not motivated. Sleep about 9 hours.  Energy OK.  Not much to concentrate on.  Hard to read. ADD lack of focus is a problem on reading and is a concern for school.  Had racing heart before stimulants and they made it worse. Can get this with anxiety and get dizzy. Leaves for school 8/31. Kim [name] first year.  In [place].    03/07/20 appt with the following noted: Abilify  5 definitely helped with depression. Dep 3/10.   More active.  Better productivity.  Better mood. Some difference with OCD.  Not as big improvement as with depression but better.  Not sure about time spent with OCD.  Anxiety generally 5/10/  Worst times 8/10.   Biggest  concern about OCD and school is the potential time involved.  Went time to do school work sometimes hard to resist OCD.  Not very concerned about OCD affecting her socially.   SE: Some hunger at times.  Restlessness fine now.   Plan: Continue Lexapro  at current dose Increase Abilify  to 7-8 mg now and if improved then no higher. If no improvement then increase to 10 mg daily.   06/25/2020 appointment with the following noted: Mostly good but not much improvement with OCD.  Depression changing like usual.  Weather affects her and other factors.  History of seasonal depression.  More tired and sleep more.  Annoying but not a huge deal.   Increase in Abilify  without sig change.   Finishes exams Thursday and break for a month. No SE.   Able to enjoy things. Handwashing 5 min each time she washes them.  Hard to know how much total time. Tremor before Lexapro .    Plan: Continue Lexapro  but increase to 35 mg daily for 2 weeks, then 40 mg daily. High dose medically necessary.  Needs liquid and couldn't tolerate the sertraline. After exams drop Abilify  back to 5 mg bc not better with increase to 10 mg.  08/12/2020 telephone call with the following noted: Kim [name] was taken off of Abilify  and had her Lexapro  increased. Since doing this she is feeling worse, more depressed and lack of energy.  She would like to know what she needs to do to help with this MD response: Her last appointment was 06/25/2020.  Prior to that appointment we had increased Abilify  from 5 mg to 10 mg without additional improvement.  Therefore the instructions were to reduce the Abilify  back to 5 mg daily.  She was not instructed to discontinue it. Because the increase in Abilify  had not been helpful from 5 to 10 mg, in addition to reducing the Abilify  back to 5 mg she was to increase the Lexapro  from 30 mg to 40 mg. It is unlikely that increasing Lexapro  would make her more depressed.  I think the depression is worse because she stopped the  Abilify  apparently.  The increase in Lexapro  could possibly make her more fatigued but worsening depression could be causing the fatigue.  The benefit from restarting Abilify  5 mg daily should be quick, that is benefit within a week. Therefore I suggest she continue the current dose of Lexapro  and add 5 mg of Abilify  back.  The depression should improve within a week or so.  If she is not better by February 10 then call us  back  11/10/2020 appt noted: Stayed 1 semester at [employer] not a good fit and left.  Goiing to [employer] in the fall. Felt worse off the Abilify  and restarted at 5 mg 08/12/20 and felt better energy. Doing pretty good, pretty busy.  Couple of classes at [employer] just to get some credit.s OCD is about the same without much change with increase in Lexapro .  No SE noted. Anxiety is fairly high usually associated with obsessive thoughts. Not too much depression. Sleep regular. NO SE.   Not regular Zofran  but occ and not sure what causes the nausea. Plan: Reduce Lexapro  to 20 ml and add 5 ml fluoxetine  for 1 week, Then reduce Lexapro  to 10 ml and increase fluoxetine  to 10 ml for 1 week, Then stop Lexapro  and increase fluoxetine  to 15 ml daily  02/16/2021 appointment with the following noted:  Seen with DAD Switched to fluoxetine  15 ml and energy and motivation and mood is better.  Anxiety is a little better but not b much.  OCD is about the same. No problems with the switch in meds.   Hard to tell how much time OCD takes bc she can mutifunciton while does compulsions. Avoids contaminants and handwashes but can work at [employer]. No SE.   Not really depressed.   Takes Zofran  intermittently a couple times per week can be triggered with caffeine. Father says she's seemed to be doing well and excited about school. Going to [employer] to [employer]. Less tremor. Plan: Option increase fluoxetine  to 80 mg daily for OCD.  She'd like to try  it.  07/01/2021 appointment with the following noted: Didn't increase bc fluoxetine  dose is the same. 60 mg daily. Been doing fairly well except for when off for several weeks.  When on it I feel good. No SE Don't like the quillivant  XR bc triggers intrusive SI and when coming off has depression and SI.  Not had this with other stimulants.  Takes it rarely. Still on aripiprazole  5 ml daily. No other SE. Mildly depressed.   Can have a good portion of time with OCD but doesn't always interfere with other things. Still nausea and not sure why.  Not caused by meds. When off Prozac  grades went down. Goes back to school Jan 11.  01/04/2022 appointment with  the following noted: Psychiatric hospitalization 11/06/2021 until 11/12/2021 at [facility] in [place] for depression with suicidal ideation after drinking alcohol with a bottle of cough syrup and use of marijuana as well as noncompliance with psychiatric medications. Prescribed Abilify  5 mg daily along with fluoxetine  40 mg daily She's at home. She doesn't remember much about how she was doing then.  She had stopped meds a month or 2 before hosp but not sure why.  Before hosp was a few drinks 4 nights a week.  With drew from 1 class and passed the other 2.  Was smoking marijuana not daily but soemtimes multiple times per day.  Sometimes missed classes from oversleeping.  No opiates.   I think I might be bipolar bc was careening back and forth emotionally. Didn't sleep last night. Done some research on bipolar 2 including hyperactivity, with bursts of creativity and not sleeping. Also hypertalkative and hypersexual and risk taking and grandiose thinking.  Upswings might last days.  Mixed states also. Distinct depressions might last weeks.  Not much normal mood. Been at beach with friend last week.  No comments from family or friend. F thinks she might be bipolar. Has compulsions but not anxious about it. Handwashing a lot less than in the past. No  panic and no avoidance. Had anxiety about classes bc wasn't prepared enough. Swallowing is better now. Nausea for years without pattern. Consistent with meds. Consistent with Abilify  5 and fluoxetine  80 Plan: She elects to increase Abilify  to 10 mg daily.  01/22/22 appt noted: Increase Abilify  10 mg daily with SE mouth tics and reduced to 5 mg daily.  No one commented.  Moving around jaw would cause some pain soreness..  better on lower dose re: SE. Reduced just recently. Mood has been high and probably hypomanic or manic. Sometimes hears people knock on window or hears her name.  Weird. Staying up late and some EMA.  Avg 4-6 hours per night.  Some nights no sleep.   Mood and confidence and energy increase.  Always a bad spender, some increased risk taking behavior.  More talkative. Anxiety has been alright and not as bad as it was.   Nausea but no other sx physically. Plan: Trileptal  and increase to 150 mg every morning and 300 mg nightly.   Reduced fluxoeine to 60 DT rapid cycling Reduced Abilify  to 5 mg daily. Continue Vyvanse  30  02/12/2022 appointment with the following noted: Not taking Trilepotal, stopped Trileptal  DT increase SI so stopped.  Took it a few days. Still having some SI.  Not all the time but not too strong bc no plan.  If sees knife will have the thought.   Off Abilify . Has been depressed since after vacation.  Low energy and mood, little motivation.  Neg self talk. No irritability or anger. Sleep too much 10+ hours.  No napping. Not doing much.   At the beach was feeling fine.   Main stress, boredom and loneliness. Planning to return to school and feels capable.  Benefit structure and friends. Not much anxiety or OCD. Mouth tics resolved. Persistent N and lost 10# bc hard to eat. Plan: Start Lithobid  300 mg nightly x 4 days then 600 mg nightly.  03/19/22 appt noted: Sharing apt. Good with room mates. Start Lithobid  300 mg nightly x 4 days then 600 mg nightly.   No effect noted. No SI Up and down with downs more noticeable.  Poor concept of time.  But days duration. With lows  doesn't want to do anything. Only missed 2 classes.  Lays around when depressed. Sometimes can tell some mania and high energy and want to do so much stuff.SABRA  around the end of last week lasting 4-5 days. Not suire about mood now, can't identify how I'm feeling. Seeing out people and don't want to be alone.  Wouldn't say I'm not depressed. Not much anxiety. OCD not bad at all Rare trazodone . Worked with some hangover. nNausea is about the same. No cause known and has appt with doctor. Mouth tics much better off Abilify  Sleep irregular from 4-12 hours.  Sometimes don't feel like sleeping.    04/21/22 appt noted: Taking lithium  600 mg daily. Missed Vyvanse  and N is better.  Did help with ADD. Sleep is better but more than she would like. Willing to try increasing the lithium . She wants help with ADD but has not tolerated stimulants very well.  She is keeping up with class work for the most part and is not missing classes generally.  She needs to sleep more than she would like about 10 hours.  No major mood swings.  No suicidal thoughts since she was last here.  Anxiety is manageable but she does feel somewhat stressed by her schoolwork. Plan: Still rec increase  Lithobid  900 mg nightly. Check lithium  level.  09/22/22 appt noted: Made changes noted above.  So much better with change to daylight savings time.  But has had a lot of depression and some SI.  Really bad Nov-Dec .  Didn't go back to college.  Not good Jan & Feb.  March better with spring but not as good as she would like.  SI as recent as last week.  Can be triggered if feeling unwanted in social situations.  But can be triggered without reason.   Dep better on spring break with friends lately.  Overall dep 6-7/10 and anxiety 4/10 lately. No sig manic sx since here.   No sig SE.    Plan: Due to rapid cycling we have  reduced the fluoxetine  to 40 mg daily. Took 60 for 7 weeks.   Increase fluoxetine  to 60 mg daily for depression. Continue lihtium 900, modafinil  100-200 mg AM prn ADD sx., trazodone  50 mg HS prn  09/29/22 TC:  Patient was last seen on 3/13 and her fluoxetine  dose was increased. She is reporting frequent mood changes, said she was probably hypomanic, now describing increased depression, having anger outbursts which she said is not like her. She reports intermittent awakening at night, but is able to go back to sleep.    She also had an issue with not being able to get her modafinil .     MD resp: We need to stop the mood swings as top priority bc the depression will not be controlled as long as having mood swings.  Lithium  should be preventing the mood swings but is not and that may be due to blood level being too low.  I have requested her to get a blood level sometime ago but I have never received 1.  In order to speed the process up have her increase the lithium  to 4 tablets daily and get a blood level in a week.  The day before the blood test take the lithium  as normal and get the blood test approximately 12 hours after the last dose of lithium .  It is very important that she get that level so we can determine whether lithium  is going to work for her  or not.  But she needs to wait 5 to 7 days after increasing the lithium  to 4 tablets daily before getting the blood level. Ill send lihtium level order to [facility].     10/25/22 TC:  Pt called 1:04pm today. [facility] and they took bloodwork on her but think she had lithium  toxicity. She went to [facility] in [facility]. She was taking 1200mg  of lithium  right now. She is not sure what to do and lab results haven't come back yet. (Shakiness, Unsteady, Dizzy, cloudy, Vision blurred, stomach upset , fatigued, body aches)    MD resp:  RTC  Lithium  level 1.1.  stopped a couple of days ago.  Complaining of sx starting Thursday.    No change in sx or meds.  Was feeling fatigued.   Drinking fluids now.   Stopped lithium  Saturday.   Stay off lithium  for a couple of days and then call back when sx resolve.  No other changes.   No SI lately.    She agrees. Lorene Macintosh, MD, DFAPA     11/02/22 appt noted: Stopped lithium  on weekend.  Restarted at 300 mg daily. Feels a little shakey, nausea, mild dizzy feeling.  Mild tremor. No med changes or additions last week.  Eating and drinking normally. Over the last month mood is pretty good overall.   No SI in the last month. She has felt physically anxious and can't place why.  Not much OCD right now. More energy and focus in the day with modafinil . No NSAIDs. Lithium  does seem to work and would like to continue it.   Had some tremor before lithium .  Uncertain if family hisotry of it. Plan; no changes  11/12/22 appt noted: Overall N is better and less Zofran . Propranolol  prn anxiety Normal amount of tremor and nausea.  No longer unsteady.   Mentally ok.   Kim [name] living with them and that has helped mood. Uncertain future.  Goal is to save up enough for apt.  But friend may be there a long while.  Met first year college of [employer]. Not lonely or bored all the time and those are the things that really get me.   No SI since here. Nothing terribly excessive anxiety but some residual OCD is the main source of the anxiety. No SE now.   Asks about increasing lithium . Plan: Modafinil  off label DT poor tolerance stimulants, modafinil  100 mg AM.    03/24/23 appt noted:  goes by Kim [name] Couple NS since here Meds: fluoxetine  60 mg for couple mos, modafinil  200 mg AM, lithobid  600 Modafinil  helped focus and attention.   No SE Increased fluoxetine  to 60. Not bad mood but disconnected. No usual stress.   At home.  Starting new job FT early October.  Excited. Cut leg since here and had to get stitches middle August.  Dont' remember how she was feeling at the time.  No SI.  No  sig SI since here.  Losing time and can't recall wht she did yesterday.  Alcohol 3-4 days per week 2-3 beers and on weekend more.  Occ THC. No other drugs.   No NM.  Weird dreams and thinks their repetitive.  Sleep about 9 hours.   Not much obsessive thinking. No sig trauma history.  Not much other anxiety.   N is better. Plan retry Wellbutrin  with other meds for mood , anhedonia  07/28/23 appt noted: with mother, Kim [name] Psych meds: stopped Wellbutrin  XL  300 every morning, incre fluoxetine  80 daily, Lithobid  600 daily, off modafinil  200 every morning bc didn't get a RF, propranolol  10 mg 1-2 twice daily as needed anxiety or tremor. Kim [name] reports stopped Wellbutrin  DT N, dizziness, sleepiness. Last month incr fluoxetine  to 80 mg on her own for depression.  Winter typically low energy.  Affects function.   Also depressed.  Not much effect with increase fluoxetine . No current SE. Having joint pain all the time.  Esp if in one position too long.   Sleep 9-10 hours interrupted.  Sleep more in winter.   M notes for several months withdrawn and hard to converse.  Will isolate in her room.  Sees her with hopelessness.   Discouraged over not finding a job.  Lack of energy and motivation.  No self harm.   F benefit pramipexole.  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 Previous Melville Gu without help.  GF OCD. F bipolar lithium  with benefit.  Past Psychiatric Medication Trials: History of Tourette's, ADD, OCD History of ADD brief trials Adderall XR 15, guanfacine ER 1 tired, methylphenidate , Strattera  Quillevent SI Vyvanse  N Modafinil  100 tolerated. Lithium  900 tolerated; 1200 SE Lexapro  40, sertraline liquid brief hard to swallow,   Fluoxetine  60 better mood Wellbutrin  SE mirtazapine,   Abilify  10 EPS mouth tics & no better than 5 which helped. Trileptal  brief increase SI  Psychiatric hospitalization 11/06/2021 until 11/12/2021 at [facility] in [place] for depression with  suicidal ideation after drinking alcohol with a bottle of cough syrup and use of marijuana as well as noncompliance with psychiatric medications. Prescribed Abilify  5 mg daily along with fluoxetine  40 mg daily. Had EPS with increased Abilify  to 10 mg daily.   Stopped DT mouth tics.  Review of Systems:  Review of Systems  Cardiovascular:  Negative for palpitations.  Gastrointestinal:  Negative for nausea.  Musculoskeletal:  Positive for arthralgias.  Neurological:  Negative for tremors and weakness.  Psychiatric/Behavioral:  Positive for dysphoric mood. Negative for sleep disturbance.     Medications: I have reviewed the patient's current medications.  Current Outpatient Medications  Medication Sig Dispense Refill   FLUoxetine  (PROZAC ) 20 MG capsule TAKE 3 CAPSULES BY MOUTH EVERY DAY (Patient taking differently: Take 80 mg by mouth daily.) 270 capsule 0   lithium  carbonate (LITHOBID ) 300 MG ER tablet TAKE 3 TABLETS BY MOUTH EVERY DAY AT 12 NOON (Patient taking differently: Take 600 mg by mouth daily.) 45 tablet 0   ondansetron  (ZOFRAN -ODT) 8 MG disintegrating tablet TAKE 1 TABLET BY MOUTH EVERY 8 HOURS AS NEEDED FOR NAUSEA AND VOMITING 20 tablet 0   propranolol  (INDERAL ) 10 MG tablet TAKE 1-2 TABLETS TWICE DAILY AS NEEDED FOR ANXIETY OR TREMOR 60 tablet 0   modafinil  (PROVIGIL ) 200 MG tablet TAKE 1 EVERY DAY IN THE MORNING **PA DENIED** 30 tablet 1   traZODone  (DESYREL ) 50 MG tablet Take 1 tablet (50 mg total) by mouth at bedtime as needed for sleep. (Patient not taking: Reported on 07/25/2023) 30 tablet 0   No current facility-administered medications for this visit.    Medication Side Effects: None  Allergies: No Known Allergies  Past Medical History:  Diagnosis Date   Constipation    GERD (gastroesophageal reflux disease)    Granuloma of skin    on forehead    Family History  Problem Relation Age of Onset   GER disease Father    Ulcerative colitis Father    ADD / ADHD  Father    Anxiety  disorder Father    Depression Father    Bipolar disorder Father    Migraines Paternal Grandmother    Anxiety disorder Paternal Grandmother    Depression Paternal Grandmother    Seizures Neg Hx    Autism Neg Hx    Schizophrenia Neg Hx     Social History   Socioeconomic History   Marital status: Single    Spouse name: n/a   Number of children: 0   Years of education: Not on file   Highest education level: Not on file  Occupational History   Occupation: student  Tobacco Use   Smoking status: Never   Smokeless tobacco: Never  Vaping Use   Vaping status: Never Used  Substance and Sexual Activity   Alcohol use: No   Drug use: No   Sexual activity: Never  Other Topics Concern   Not on file  Social History Narrative   Lives with mom, dad, brother in the same house. She is in the 11th grade at [employer]. Does well in school. She enjoys listening to music, writing and singing.    Social Drivers of Corporate Investment Banker Strain: Not on file  Food Insecurity: Not on file  Transportation Needs: Not on file  Physical Activity: Not on file  Stress: Not on file  Social Connections: Unknown (02/24/2023)   Received from [facility]   Social Network    Social Network: Not on file  Intimate Partner Violence: Not At Risk (02/24/2023)   Received from [facility]   HITS    Over the last 12 months how often did your partner physically hurt you?: Never    Over the last 12 months how often did your partner insult you or talk down to you?: Never    Over the last 12 months how often did your partner threaten you with physical harm?: Never    Over the last 12 months how often did your partner scream or curse at you?: Never    Past Medical History, Surgical history, Social history, and Family history were reviewed and updated as appropriate.   Please see review of systems for further details on the patient's review from today.   Objective:   Physical Exam:   There were no vitals taken for this visit.  Physical Exam Constitutional:      General: She is not in acute distress.    Appearance: She is well-developed.  Musculoskeletal:        General: No deformity.  Neurological:     Mental Status: She is alert and oriented to person, place, and time.     Cranial Nerves: No dysarthria.     Coordination: Coordination normal.  Psychiatric:        Attention and Perception: Attention and perception normal.        Mood and Affect: Mood is anxious and depressed. Affect is not labile, blunt, angry or inappropriate.        Speech: Speech normal.        Behavior: Behavior normal. Behavior is cooperative.        Thought Content: Thought content normal. Thought content is not paranoid or delusional. Thought content does not include homicidal or suicidal ideation. Thought content does not include suicidal plan.        Cognition and Memory: Cognition and memory normal.        Judgment: Judgment normal.     Comments: Insight intact Anxiety manageable  Depression moderate but anhedonia,  Fidgety ongoing  Lab Review:  No results found for: NA, K, CL, CO2, GLUCOSE, BUN, CREATININE, CALCIUM , PROT, ALBUMIN, AST, ALT, ALKPHOS, BILITOT, GFRNONAA, GFRAA  No results found for: WBC, RBC, HGB, HCT, PLT, MCV, MCH, MCHC, RDW, LYMPHSABS, MONOABS, EOSABS, BASOSABS  No results found for: POCLITH, LITHIUM    No results found for: PHENYTOIN, PHENOBARB, VALPROATE, CBMZ   Review [facility] testing dated September 30, 2017.  SLC 6 A4 was long long.  HDR2A was normal genotype. Pharmacokinetic genes included normal 2D6, 3 A4, 2 C9, 2C19, 1A2, and intermediate 2B 6  10/23/22 Lithium  level 1.1. while taking 1200 mg day but had   .res Assessment: Plan:    Felicha Kim [name] was seen today for follow-up, depression, anxiety, add and sleeping problem.  Diagnoses and all orders for this visit:  Bipolar  affective disorder, rapid cycling (HCC)  Mixed obsessional thoughts and acts  Attention deficit hyperactivity disorder (ADHD), combined type, moderate -     modafinil  (PROVIGIL ) 200 MG tablet; TAKE 1 EVERY DAY IN THE MORNING **PA DENIED**  Tourette's disorder  Lithium  use     30 min face to face time with patient was spent on counseling and coordination of care.   SX overall under fair control but she'd like to increase lithiumbc mood is not as good as it should be.  Discussed her concerns that she may have bipolar disorder type II, recent hypomania.  She has done some reading on the subject.  That appears to be accurate.  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  We discussed how the presence of SSRIs used for her OCD may increase the risk of rapid cycling.  However there is not a good alternative to SSRIs for OCD. Extensive discussion about alternative types of mood stabilizers including lithium , Depakote and atypical antipsychotics.  Lithium  is not reliably effective for rapid cycling but father is bipolar with benefit from lithium .  We discussed the option of Depakote but not ideal in woman of child bearing age but we may have little other options DT apprent SE antipsychotic.  Because of mouth tics would like to avoid antipsychotics if possible. T  Due to the suicidal thoughts lithium  would be a good choice.  Her father also responded to lithium .  Informed her we could go up in the dose if she starts developing manic symptoms.  We discussed again the importance of the mood stabilizer.  We will use Lithobid  because of nausea problems. Increase   Lithobid  600 mg nightly and if needed back to 900 mg nightly.  Counseled patient regarding potential benefits, risks, and side effects of lithium  to include potential risk of lithium  affecting thyroid  and renal function.  Discussed need for periodic lab monitoring to determine drug level and to assess for potential adverse  effects.  Counseled patient regarding signs and symptoms of lithium  toxicity and advised that they notify office immediately or seek urgent medical attention if experiencing these signs and symptoms.  Patient advised to contact office with any questions or concerns. Recent sx lithium  toxicity resolved.  For mood reduce back to fluoxetine  to 60 mg daily for depression and she's continued it.      She has learned to swallow capsules while at the hospital.  We discussed the risk of worsening OCD at the lower dose  Coffee worsens tremor discussed and she's aware.  Resume Modafinil  off label DT poor tolerance stimulants, modafinil  100 mg AM.  Off label for ADD bc N with Vyvanse  and poor mood tolerance.  Tolerating this well. And it helped.   Vraylar  1.5 mg daily for bipolar depression.  samples Discussed potential metabolic side effects associated with atypical antipsychotics, as well as potential risk for movement side effects. Advised pt to contact office if movement side effects occur.  This has the potential of being monotherapy.  Disc other options.   Discussed the importance of avoiding marijuana and alcohol abuse at this time  We discussed her father's psychiatric meds and the potential for use of lamotrigine.  We will we will defer this choice at this time in order to keep medications as simple as possible  vitamin D 4000-5000 units daily in winter.  Disc counseling  Consider lamotrigine for seasonal depression  Call if symptoms worsen or she has significant side effects.  FU 1 mos  Lorene Macintosh, MD, DFAPA  Please see After Visit Summary for patient specific instructions.  Future Appointments  Date Time Provider Department Center  08/24/2023  3:30 PM Cottle, Lorene KANDICE Raddle., MD [facility] None  09/28/2023  2:00 PM Cottle, Lorene KANDICE Raddle., MD [facility] None          No orders of the defined types were placed in this encounter.    -------------------------------

## 2023-07-27 ENCOUNTER — Other Ambulatory Visit: Payer: Self-pay | Admitting: Psychiatry

## 2023-07-27 DIAGNOSIS — F422 Mixed obsessional thoughts and acts: Secondary | ICD-10-CM

## 2023-07-27 NOTE — Telephone Encounter (Signed)
 Lv 1/16 incre fluoxetine  80 daily

## 2023-07-30 ENCOUNTER — Other Ambulatory Visit: Payer: Self-pay | Admitting: Psychiatry

## 2023-07-30 DIAGNOSIS — T56891A Toxic effect of other metals, accidental (unintentional), initial encounter: Secondary | ICD-10-CM

## 2023-07-30 DIAGNOSIS — R251 Tremor, unspecified: Secondary | ICD-10-CM

## 2023-08-04 DIAGNOSIS — J111 Influenza due to unidentified influenza virus with other respiratory manifestations: Secondary | ICD-10-CM | POA: Diagnosis not present

## 2023-08-10 DIAGNOSIS — M2550 Pain in unspecified joint: Secondary | ICD-10-CM | POA: Diagnosis not present

## 2023-08-22 DIAGNOSIS — Z0000 Encounter for general adult medical examination without abnormal findings: Secondary | ICD-10-CM | POA: Diagnosis not present

## 2023-08-24 ENCOUNTER — Ambulatory Visit: Payer: BC Managed Care – PPO | Admitting: Psychiatry

## 2023-08-24 ENCOUNTER — Encounter: Payer: Self-pay | Admitting: Psychiatry

## 2023-08-24 DIAGNOSIS — F319 Bipolar disorder, unspecified: Secondary | ICD-10-CM | POA: Diagnosis not present

## 2023-08-24 DIAGNOSIS — F422 Mixed obsessional thoughts and acts: Secondary | ICD-10-CM | POA: Diagnosis not present

## 2023-08-24 DIAGNOSIS — F902 Attention-deficit hyperactivity disorder, combined type: Secondary | ICD-10-CM

## 2023-08-24 MED ORDER — FLUOXETINE HCL 20 MG PO CAPS: 60.0000 mg | ORAL_CAPSULE | Freq: Every day | ORAL | 1 refills | Status: DC

## 2023-08-24 MED ORDER — LITHIUM CARBONATE ER 300 MG PO TBCR
600.0000 mg | EXTENDED_RELEASE_TABLET | Freq: Every day | ORAL | 0 refills | Status: DC
Start: 2023-08-24 — End: 2023-11-28

## 2023-08-24 MED ORDER — CARIPRAZINE HCL 1.5 MG PO CAPS: 1.5000 mg | ORAL_CAPSULE | Freq: Every day | ORAL | 1 refills | Status: DC

## 2023-08-24 MED ORDER — MODAFINIL 200 MG PO TABS: ORAL_TABLET | ORAL | 2 refills | Status: DC

## 2023-08-24 NOTE — Progress Notes (Signed)
 Kim [name] [id number] Jan 22, 2001 22 y.o.   Subjective:   Patient ID:  Kim [name] is a 23 y.o. (DOB 2000/08/23) female.  Chief Complaint:  Chief Complaint  Patient presents with   Follow-up   Depression   Anxiety    HPI Kim [name] presents today for follow-up of OCD, ADD, depression  02/20/20 appt with the following noted: Seen with mother [name] and father [name].  Rob needing psych help but gave up his appt for daughter to be seen before going to school. She's going to [place] to school. She has OCD, ADD, Tourette's and depression.  They wonder about other med changes. She needs liquids bc anxiety swallowing pills. Pt doesn't drive DT anxiety. Would like help with OCD.  Hesitant about ADD meds but poor tolerance with anxiety. Contamination fears with handwashing.  Germs in general and fear of vomtiing so hard to take pills. Handwashes time hard to say but a couple of minutes.  Irritating time.   Triggers include cleaning.   She will avoid hospitals and in general doctors offices. Mo concerned OCD takes a lot of time and attention. She recognizes OCD started in middle school. Parents in support of her going to school Depressed but not severe.  Not a direct consequence of OCD.  Feels exhausted and not motivated. Sleep about 9 hours.  Energy OK.  Not much to concentrate on.  Hard to read. ADD lack of focus is a problem on reading and is a concern for school.  Had racing heart before stimulants and they made it worse. Can get this with anxiety and get dizzy. Leaves for school 8/31. [name] first year.  In [place].    03/07/20 appt with the following noted: Abilify 5 definitely helped with depression. Dep 3/10.   More active.  Better productivity.  Better mood. Some difference with OCD.  Not as big improvement as with depression but better.  Not sure about time spent with OCD.  Anxiety generally 5/10/  Worst times 8/10.   Biggest concern about OCD and school  is the potential time involved.  Went time to do school work sometimes hard to resist OCD.  Not very concerned about OCD affecting her socially.   SE: Some hunger at times.  Restlessness fine now.   Plan: Continue Lexapro at current dose Increase Abilify to 7-8 mg now and if improved then no higher. If no improvement then increase to 10 mg daily.   06/25/2020 appointment with the following noted: Mostly good but not much improvement with OCD.  Depression changing like usual.  Weather affects her and other factors.  History of seasonal depression.  More tired and sleep more.  Annoying but not a huge deal.   Increase in Abilify without sig change.   Finishes exams Thursday and break for a month. No SE.   Able to enjoy things. Handwashing 5 min each time she washes them.  Hard to know how much total time. Tremor before Lexapro.    Plan: Continue Lexapro but increase to 35 mg daily for 2 weeks, then 40 mg daily. High dose medically necessary.  Needs liquid and couldn't tolerate the sertraline. After exams drop Abilify back to 5 mg bc not better with increase to 10 mg.  08/12/2020 telephone call with the following noted: Kim [name] was taken off of Abilify and had her Lexapro increased. Since doing this she is feeling worse, more depressed and lack of energy. She would like to know what she  needs to do to help with this MD response: Her last appointment was 06/25/2020.  Prior to that appointment we had increased Abilify from 5 mg to 10 mg without additional improvement.  Therefore the instructions were to reduce the Abilify back to 5 mg daily.  She was not instructed to discontinue it. Because the increase in Abilify had not been helpful from 5 to 10 mg, in addition to reducing the Abilify back to 5 mg she was to increase the Lexapro from 30 mg to 40 mg. It is unlikely that increasing Lexapro would make her more depressed.  I think the depression is worse because she stopped the Abilify apparently.  The  increase in Lexapro could possibly make her more fatigued but worsening depression could be causing the fatigue.  The benefit from restarting Abilify 5 mg daily should be quick, that is benefit within a week. Therefore I suggest she continue the current dose of Lexapro and add 5 mg of Abilify back.  The depression should improve within a week or so.  If she is not better by February 10 then call us back  11/10/2020 appt noted: Stayed 1 semester at [employer] not a good fit and left.  Goiing to [employer] in the fall. Felt worse off the Abilify and restarted at 5 mg 08/12/20 and felt better energy. Doing pretty good, pretty busy.  Couple of classes at [employer] just to get some credit.s OCD is about the same without much change with increase in Lexapro.  No SE noted. Anxiety is fairly high usually associated with obsessive thoughts. Not too much depression. Sleep regular. NO SE.   Not regular Zofran but occ and not sure what causes the nausea. Plan: Reduce Lexapro to 20 ml and add 5 ml fluoxetine for 1 week, Then reduce Lexapro to 10 ml and increase fluoxetine to 10 ml for 1 week, Then stop Lexapro and increase fluoxetine to 15 ml daily  02/16/2021 appointment with the following noted:  Seen with DAD Switched to fluoxetine 15 ml and energy and motivation and mood is better.  Anxiety is a little better but not b much.  OCD is about the same. No problems with the switch in meds.   Hard to tell how much time OCD takes bc she can mutifunciton while does compulsions. Avoids contaminants and handwashes but can work at [employer]. No SE.   Not really depressed.   Takes Zofran intermittently a couple times per week can be triggered with caffeine. Father says she's seemed to be doing well and excited about school. Going to [employer] to [employer]. Less tremor. Plan: Option increase fluoxetine to 80 mg daily for OCD.  She'd like to try it.  07/01/2021 appointment  with the following noted: Didn't increase bc fluoxetine dose is the same. 60 mg daily. Been doing fairly well except for when off for several weeks.  When on it I feel good. No SE Don't like the quillivant XR bc triggers intrusive SI and when coming off has depression and SI.  Not had this with other stimulants.  Takes it rarely. Still on aripiprazole 5 ml daily. No other SE. Mildly depressed.   Can have a good portion of time with OCD but doesn't always interfere with other things. Still nausea and not sure why.  Not caused by meds. When off Prozac grades went down. Goes back to school Jan 11.  01/04/2022 appointment with the following noted: Psychiatric hospitalization 11/06/2021 until  11/12/2021 at [facility] in [place] for depression with suicidal ideation after drinking alcohol with a bottle of cough syrup and use of marijuana as well as noncompliance with psychiatric medications. Prescribed Abilify 5 mg daily along with fluoxetine 40 mg daily She's at home. She doesn't remember much about how she was doing then.  She had stopped meds a month or 2 before hosp but not sure why.  Before hosp was a few drinks 4 nights a week.  With drew from 1 class and passed the other 2.  Was smoking marijuana not daily but soemtimes multiple times per day.  Sometimes missed classes from oversleeping.  No opiates.   I think I might be bipolar bc was careening back and forth emotionally. Didn't sleep last night. Done some research on bipolar 2 including hyperactivity, with bursts of creativity and not sleeping. Also hypertalkative and hypersexual and risk taking and grandiose thinking.  Upswings might last days.  Mixed states also. Distinct depressions might last weeks.  Not much normal mood. Been at beach with friend last week.  No comments from family or friend. F thinks she might be bipolar. Has compulsions but not anxious about it. Handwashing a lot less than in the past. No panic and no avoidance. Had  anxiety about classes bc wasn't prepared enough. Swallowing is better now. Nausea for years without pattern. Consistent with meds. Consistent with Abilify 5 and fluoxetine 80 Plan: She elects to increase Abilify to 10 mg daily.  01/22/22 appt noted: Increase Abilify 10 mg daily with SE mouth tics and reduced to 5 mg daily.  No one commented.  Moving around jaw would cause some pain soreness..  better on lower dose re: SE. Reduced just recently. Mood has been high and probably hypomanic or manic. Sometimes hears people knock on window or hears her name.  Weird. Staying up late and some EMA.  Avg 4-6 hours per night.  Some nights no sleep.   Mood and confidence and energy increase.  Always a bad spender, some increased risk taking behavior.  More talkative. Anxiety has been alright and not as bad as it was.   Nausea but no other sx physically. Plan: Trileptal and increase to 150 mg every morning and 300 mg nightly.   Reduced fluxoeine to 60 DT rapid cycling Reduced Abilify to 5 mg daily. Continue Vyvanse 30  02/12/2022 appointment with the following noted: Not taking Trilepotal, stopped Trileptal DT increase SI so stopped.  Took it a few days. Still having some SI.  Not all the time but not too strong bc no plan.  If sees knife will have the thought.   Off Abilify. Has been depressed since after vacation.  Low energy and mood, little motivation.  Neg self talk. No irritability or anger. Sleep too much 10+ hours.  No napping. Not doing much.   At the beach was feeling fine.   Main stress, boredom and loneliness. Planning to return to school and feels capable.  Benefit structure and friends. Not much anxiety or OCD. Mouth tics resolved. Persistent N and lost 10# bc hard to eat. Plan: Start Lithobid 300 mg nightly x 4 days then 600 mg nightly.  03/19/22 appt noted: Sharing apt. Good with room mates. Start Lithobid 300 mg nightly x 4 days then 600 mg nightly.  No effect noted. No SI Up  and down with downs more noticeable.  Poor concept of time.  But days duration. With lows doesn't want to do anything. Only missed  2 classes.  Lays around when depressed. Sometimes can tell some mania and high energy and want to do so much stuff.Marland Kitchen  around the end of last week lasting 4-5 days. Not suire about mood now, can't identify how I'm feeling. Seeing out people and don't want to be alone.  Wouldn't say I'm not depressed. Not much anxiety. OCD not bad at all Rare trazodone. Worked with some hangover. nNausea is about the same. No cause known and has appt with doctor. Mouth tics much better off Abilify Sleep irregular from 4-12 hours.  Sometimes don't feel like sleeping.    04/21/22 appt noted: Taking lithium 600 mg daily. Missed Vyvanse and N is better.  Did help with ADD. Sleep is better but more than she would like. Willing to try increasing the lithium. She wants help with ADD but has not tolerated stimulants very well.  She is keeping up with class work for the most part and is not missing classes generally.  She needs to sleep more than she would like about 10 hours.  No major mood swings.  No suicidal thoughts since she was last here.  Anxiety is manageable but she does feel somewhat stressed by her schoolwork. Plan: Still rec increase  Lithobid 900 mg nightly. Check lithium level.  09/22/22 appt noted: Made changes noted above.  So much better with change to daylight savings time.  But has had a lot of depression and some SI.  Really bad Nov-Dec .  Didn't go back to college.  Not good Jan & Feb.  March better with spring but not as good as she would like.  SI as recent as last week.  Can be triggered if feeling unwanted in social situations.  But can be triggered without reason.   Dep better on spring break with friends lately.  Overall dep 6-7/10 and anxiety 4/10 lately. No sig manic sx since here.   No sig SE.    Plan: Due to rapid cycling we have reduced the fluoxetine to 40 mg  daily. Took 60 for 7 weeks.   Increase fluoxetine to 60 mg daily for depression. Continue lihtium 900, modafinil 100-200 mg AM prn ADD sx., trazodone 50 mg HS prn  09/29/22 TC:  Patient was last seen on 3/13 and her fluoxetine dose was increased. She is reporting frequent mood changes, said she was probably hypomanic, now describing increased depression, having anger outbursts which she said is not like her. She reports intermittent awakening at night, but is able to go back to sleep.    She also had an issue with not being able to get her modafinil.     MD resp: We need to stop the mood swings as top priority bc the depression will not be controlled as long as having mood swings.  Lithium should be preventing the mood swings but is not and that may be due to blood level being too low.  I have requested her to get a blood level sometime ago but I have never received 1.  In order to speed the process up have her increase the lithium to 4 tablets daily and get a blood level in a week.  The day before the blood test take the lithium as normal and get the blood test approximately 12 hours after the last dose of lithium.  It is very important that she get that level so we can determine whether lithium is going to work for her or not.  But she needs to  wait 5 to 7 days after increasing the lithium to 4 tablets daily before getting the blood level. Ill send lihtium level order to [facility].     10/25/22 TC:  Pt called 1:04pm today. [facility] and they took bloodwork on her but think she had lithium toxicity. She went to [employer] in Clinic. She was taking 1200mg  of lithium right now. She is not sure what to do and lab results haven't come back yet. (Shakiness, Unsteady, Dizzy, cloudy, Vision blurred, stomach upset , fatigued, body aches)    MD resp:  RTC  Lithium level 1.1.  stopped a couple of days ago.  Complaining of sx starting Thursday.   No change in sx or meds.  Was  feeling fatigued.   Drinking fluids now.   Stopped lithium Saturday.   Stay off lithium for a couple of days and then call back when sx resolve.  No other changes.   No SI lately.    She agrees. Meredith Staggers, MD, DFAPA     11/02/22 appt noted: Stopped lithium on weekend.  Restarted at 300 mg daily. Feels a little shakey, nausea, mild dizzy feeling.  Mild tremor. No med changes or additions last week.  Eating and drinking normally. Over the last month mood is pretty good overall.   No SI in the last month. She has felt physically anxious and can't place why.  Not much OCD right now. More energy and focus in the day with modafinil. No NSAIDs. Lithium does seem to work and would like to continue it.   Had some tremor before lithium.  Uncertain if family hisotry of it. Plan; no changes  11/12/22 appt noted: Overall N is better and less Zofran. Propranolol prn anxiety Normal amount of tremor and nausea.  No longer unsteady.   Mentally ok.   Noel Christmas living with them and that has helped mood. Uncertain future.  Goal is to save up enough for apt.  But friend may be there a long while.  Met first year college of [employer]. Not lonely or bored all the time and those are the things that really get me.   No SI since here. Nothing terribly excessive anxiety but some residual OCD is the main source of the anxiety. No SE now.   Asks about increasing lithium. Plan: Modafinil off label DT poor tolerance stimulants, modafinil 100 mg AM.    03/24/23 appt noted:  goes by [name] NS since here Meds: fluoxetine 60 mg for couple mos, modafinil 200 mg AM, lithobid 600 Modafinil helped focus and attention.   No SE Increased fluoxetine to 60. Not bad mood but disconnected. No usual stress.   At home.  Starting new job FT early October.  Excited. Cut leg since here and had to get stitches middle August.  Dont' remember how she was feeling at the time.  No SI.  No sig SI since here.  Losing  time and can't recall wht she did yesterday.  Alcohol 3-4 days per week 2-3 beers and on weekend more.  Occ THC. No other drugs.   No NM.  Weird dreams and thinks their repetitive.  Sleep about 9 hours.   Not much obsessive thinking. No sig trauma history.  Not much other anxiety.   N is better. Plan retry Wellbutrin with other meds for mood , anhedonia  07/28/23 appt noted: with mother, [name] Psych meds: stopped Wellbutrin XL 300 every morning, incre fluoxetine 80 daily,  Lithobid 600 daily, off modafinil 200 every morning bc didn't get a RF, propranolol 10 mg 1-2 twice daily as needed anxiety or tremor. "Kim [name]" reports stopped Wellbutrin DT N, dizziness, sleepiness. Last month incr fluoxetine to 80 mg on her own for depression.  Winter typically low energy.  Affects function.   Also depressed.  Not much effect with increase fluoxetine. No current SE. Having joint pain all the time.  Esp if in one position too long.   Sleep 9-10 hours interrupted.  Sleep more in winter.   M notes for several months withdrawn and hard to converse.  Will isolate in her room.  Sees her with hopelessness.   Discouraged over not finding a job.  Lack of energy and motivation.  No self harm.   F benefit pramipexole. Plan: continue meds.  Resume modafinil. Vraylar 1.5 mg daily for bipolar depression.  samples  08/24/23 appt noted: Psych med: fluox 60, lithobid 600 HS, modafinil 200 AM, propranolol 10-20 mg BID prn , Vraylar 1.5 mg daily. Higher energy with Vraylar.  More productive.  Better mood. Seasonal dep.  Hard to say if out of depression therefeore. No SE with Vraylar. Less sleep to 7-8 hours.  Some awakening with pain.  Not drowsy.   Satisfied with meds now but ready for Spring.  No current Therapist Anne Shutter, Journalist, newspaper. Previous Eliott Nine without help.  GF OCD. F bipolar lithium with benefit.  Past Psychiatric Medication Trials: History of Tourette's, ADD, OCD History of ADD brief  trials Adderall XR 15, guanfacine ER 1 tired, methylphenidate, Strattera Quillevent SI Vyvanse N Modafinil 100 tolerated. Lithium 900 tolerated; 1200 SE Lexapro 40, sertraline liquid brief hard to swallow,   Fluoxetine 60 better mood Wellbutrin SE mirtazapine,   Abilify 10 EPS "mouth tics" & no better than 5 which helped. Trileptal brief increase SI  Psychiatric hospitalization 11/06/2021 until 11/12/2021 at [facility] in [place] for depression with suicidal ideation after drinking alcohol with a bottle of cough syrup and use of marijuana as well as noncompliance with psychiatric medications. Prescribed Abilify 5 mg daily along with fluoxetine 40 mg daily. Had EPS with increased Abilify to 10 mg daily.   Stopped DT "mouth tics".  Review of Systems:  Review of Systems  Cardiovascular:  Negative for palpitations.  Gastrointestinal:  Negative for nausea.  Musculoskeletal:  Positive for arthralgias.  Neurological:  Negative for tremors.  Psychiatric/Behavioral:  Negative for dysphoric mood and sleep disturbance.     Medications: I have reviewed the patient's current medications.  Current Outpatient Medications  Medication Sig Dispense Refill   ondansetron (ZOFRAN-ODT) 8 MG disintegrating tablet TAKE 1 TABLET BY MOUTH EVERY 8 HOURS AS NEEDED FOR NAUSEA AND VOMITING 20 tablet 0   propranolol (INDERAL) 10 MG tablet TAKE 1 TO 2 TABLETS BY MOUTH TWICE A DAY AS NEEDED FOR ANXIETY OR TREMOR 60 tablet 0   cariprazine (VRAYLAR) 1.5 MG capsule Take 1 capsule (1.5 mg total) by mouth daily. 90 capsule 1   FLUoxetine (PROZAC) 20 MG capsule Take 3 capsules (60 mg total) by mouth daily. 270 capsule 1   lithium carbonate (LITHOBID) 300 MG ER tablet Take 2 tablets (600 mg total) by mouth daily. 180 tablet 0   modafinil (PROVIGIL) 200 MG tablet TAKE 1 EVERY DAY IN THE MORNING **PA DENIED** 30 tablet 2   traZODone (DESYREL) 50 MG tablet Take 1 tablet (50 mg total) by mouth at bedtime as needed for sleep.  (Patient not taking: Reported on 08/24/2023) 30  tablet 0   No current facility-administered medications for this visit.    Medication Side Effects: None  Allergies: No Known Allergies  Past Medical History:  Diagnosis Date   Constipation    GERD (gastroesophageal reflux disease)    Granuloma of skin    on forehead    Family History  Problem Relation Age of Onset   GER disease Father    Ulcerative colitis Father    ADD / ADHD Father    Anxiety disorder Father    Depression Father    Bipolar disorder Father    Migraines Paternal Grandmother    Anxiety disorder Paternal Grandmother    Depression Paternal Grandmother    Seizures Neg Hx    Autism Neg Hx    Schizophrenia Neg Hx     Social History   Socioeconomic History   Marital status: Single    Spouse name: n/a   Number of children: 0   Years of education: Not on file   Highest education level: Not on file  Occupational History   Occupation: student  Tobacco Use   Smoking status: Never   Smokeless tobacco: Never  Vaping Use   Vaping status: Never Used  Substance and Sexual Activity   Alcohol use: No   Drug use: No   Sexual activity: Never  Other Topics Concern   Not on file  Social History Narrative   Lives with mom, dad, brother in the same house. She is in the 11th grade at [employer]. Does well in school. She enjoys listening to music, writing and singing.    Social Drivers of Corporate investment banker Strain: Not on file  Food Insecurity: Not on file  Transportation Needs: Not on file  Physical Activity: Not on file  Stress: Not on file  Social Connections: Unknown (02/24/2023)   [facility] from [facility]   Social Network    Social Network: Not on file  Intimate Partner Violence: Not At Risk (02/24/2023)   Received from [facility]   HITS    Over the last 12 months how often did your partner physically hurt you?: Never    Over the last 12 months how often did your partner insult you or talk  down to you?: Never    Over the last 12 months how often did your partner threaten you with physical harm?: Never    Over the last 12 months how often did your partner scream or curse at you?: Never    Past Medical History, Surgical history, Social history, and Family history were reviewed and updated as appropriate.   Please see review of systems for further details on the patient's review from today.   Objective:   Physical Exam:  There were no vitals taken for this visit.  Physical Exam Constitutional:      General: She is not in acute distress.    Appearance: She is well-developed.  Musculoskeletal:        General: No deformity.  Neurological:     Mental Status: She is alert and oriented to person, place, and time.     Cranial Nerves: No dysarthria.     Coordination: Coordination normal.  Psychiatric:        Attention and Perception: Attention and perception normal.        Mood and Affect: Mood is depressed. Mood is not anxious. Affect is not labile, blunt, angry or inappropriate.        Speech: Speech normal.  Behavior: Behavior normal. Behavior is cooperative.        Thought Content: Thought content normal. Thought content is not paranoid or delusional. Thought content does not include homicidal or suicidal ideation. Thought content does not include suicidal plan.        Cognition and Memory: Cognition and memory normal.        Judgment: Judgment normal.     Comments: Insight intact Anxiety manageable  Depression mild with less anhedonia,  Fidgety resolved mostly     Lab Review:  No results found for: "NA", "K", "CL", "CO2", "GLUCOSE", "BUN", "CREATININE", "CALCIUM", "PROT", "ALBUMIN", "AST", "ALT", "ALKPHOS", "BILITOT", "GFRNONAA", "GFRAA"  No results found for: "WBC", "RBC", "HGB", "HCT", "PLT", "MCV", "MCH", "MCHC", "RDW", "LYMPHSABS", "MONOABS", "EOSABS", "BASOSABS"  No results found for: "POCLITH", "LITHIUM"   No results found for: "PHENYTOIN",  "PHENOBARB", "VALPROATE", "CBMZ"   Review [facility] testing dated September 30, 2017.  SLC 6 A4 was long long.  HDR2A was normal genotype. Pharmacokinetic genes included normal 2D6, 3 A4, 2 C9, 2C19, 1A2, and intermediate 2B 6  10/23/22 Lithium level 1.1. while taking 1200 mg day but had   .res Assessment: Plan:    Jovanna "Kim [name]" was seen today for follow-up, depression and anxiety.  Diagnoses and all orders for this visit:  Bipolar affective disorder, rapid cycling (HCC) -     cariprazine (VRAYLAR) 1.5 MG capsule; Take 1 capsule (1.5 mg total) by mouth daily. -     lithium carbonate (LITHOBID) 300 MG ER tablet; Take 2 tablets (600 mg total) by mouth daily. -     Lithium level  Mixed obsessional thoughts and acts -     FLUoxetine (PROZAC) 20 MG capsule; Take 3 capsules (60 mg total) by mouth daily.  Attention deficit hyperactivity disorder (ADHD), combined type, moderate -     modafinil (PROVIGIL) 200 MG tablet; TAKE 1 EVERY DAY IN THE MORNING **PA DENIED**      30 min face to face time with patient was spent on counseling and coordination of care.   SX overall under fair control but she'd like to increase lithiumbc mood is not as good as it should be.  Discussed her concerns that she may have bipolar disorder type II, recent hypomania.  She has done some reading on the subject.  That appears to be accurate.  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  We discussed how the presence of SSRIs used for her OCD may increase the risk of rapid cycling.  However there is not a good alternative to SSRIs for OCD. Extensive discussion about alternative types of mood stabilizers including lithium, Depakote and atypical antipsychotics.  Lithium is not reliably effective for rapid cycling but father is bipolar with benefit from lithium.  We discussed the option of Depakote but not ideal in woman of child bearing age but we may have little other options DT apprent SE antipsychotic.   Because of "mouth tics" would like to avoid antipsychotics if possible. T  Due to the suicidal thoughts lithium would be a good choice.  Her father also responded to lithium.  Informed her we could go up in the dose if she starts developing manic symptoms.  We discussed again the importance of the mood stabilizer.  We will use Lithobid because of nausea problems. Icontinue lower dose  Lithobid 600 mg nightly  Repeat lithium level.  Counseled patient regarding potential benefits, risks, and side effects of lithium to include potential risk of lithium affecting thyroid  and renal function.  Discussed need for periodic lab monitoring to determine drug level and to assess for potential adverse effects.  Counseled patient regarding signs and symptoms of lithium toxicity and advised that they notify office immediately or seek urgent medical attention if experiencing these signs and symptoms.  Patient advised to contact office with any questions or concerns. sx lithium toxicity resolved.  For mood  fluoxetine to 60 mg daily for depression and she's continued it.      She has learned to swallow capsules while at the hospital.  We discussed the risk of worsening OCD at the lower dose  Coffee worsens tremor discussed and she's aware.  Resume Modafinil off label DT poor tolerance stimulants, modafinil 100-200 mg AM.  Off label for ADD bc N with Vyvanse and poor mood tolerance. Tolerating this well. And it helped.   Vraylar 1.5 mg daily for bipolar depression.  samples Discussed potential metabolic side effects associated with atypical antipsychotics, as well as potential risk for movement side effects. Advised pt to contact office if movement side effects occur.  This has the potential of being monotherapy.  Disc other options.   Discussed the importance of avoiding marijuana and alcohol abuse at this time  We discussed her father's psychiatric meds and the potential for use of lamotrigine.  We will we  will defer this choice at this time in order to keep medications as simple as possible  vitamin D 4000-5000 units daily in winter.  Disc counseling  Consider lamotrigine for seasonal depression  Call if symptoms worsen or she has significant side effects.  FU 1 mos  Meredith Staggers, MD, DFAPA  Please see After Visit Summary for patient specific instructions.  Future Appointments  Date Time Provider Department Center  09/28/2023  2:00 PM Cottle, Steva Ready., MD [facility] None  11/01/2023 10:30 AM Cottle, Steva Ready., MD [facility] None          Orders Placed This Encounter  Procedures   Lithium level     -------------------------------

## 2023-09-02 ENCOUNTER — Other Ambulatory Visit: Payer: Self-pay | Admitting: Psychiatry

## 2023-09-02 DIAGNOSIS — T56891A Toxic effect of other metals, accidental (unintentional), initial encounter: Secondary | ICD-10-CM

## 2023-09-02 DIAGNOSIS — R251 Tremor, unspecified: Secondary | ICD-10-CM

## 2023-09-05 DIAGNOSIS — M25512 Pain in left shoulder: Secondary | ICD-10-CM | POA: Diagnosis not present

## 2023-09-05 DIAGNOSIS — M549 Dorsalgia, unspecified: Secondary | ICD-10-CM | POA: Diagnosis not present

## 2023-09-05 DIAGNOSIS — M79671 Pain in right foot: Secondary | ICD-10-CM | POA: Diagnosis not present

## 2023-09-05 DIAGNOSIS — M542 Cervicalgia: Secondary | ICD-10-CM | POA: Diagnosis not present

## 2023-09-05 DIAGNOSIS — M25511 Pain in right shoulder: Secondary | ICD-10-CM | POA: Diagnosis not present

## 2023-09-05 DIAGNOSIS — M2550 Pain in unspecified joint: Secondary | ICD-10-CM | POA: Diagnosis not present

## 2023-09-05 DIAGNOSIS — M79672 Pain in left foot: Secondary | ICD-10-CM | POA: Diagnosis not present

## 2023-09-05 DIAGNOSIS — M791 Myalgia, unspecified site: Secondary | ICD-10-CM | POA: Diagnosis not present

## 2023-09-05 DIAGNOSIS — M25561 Pain in right knee: Secondary | ICD-10-CM | POA: Diagnosis not present

## 2023-09-05 DIAGNOSIS — R768 Other specified abnormal immunological findings in serum: Secondary | ICD-10-CM | POA: Diagnosis not present

## 2023-09-05 DIAGNOSIS — M79641 Pain in right hand: Secondary | ICD-10-CM | POA: Diagnosis not present

## 2023-09-05 DIAGNOSIS — M25562 Pain in left knee: Secondary | ICD-10-CM | POA: Diagnosis not present

## 2023-09-05 DIAGNOSIS — M79642 Pain in left hand: Secondary | ICD-10-CM | POA: Diagnosis not present

## 2023-09-15 ENCOUNTER — Other Ambulatory Visit: Payer: Self-pay | Admitting: Psychiatry

## 2023-09-15 DIAGNOSIS — R251 Tremor, unspecified: Secondary | ICD-10-CM

## 2023-09-15 DIAGNOSIS — T56891A Toxic effect of other metals, accidental (unintentional), initial encounter: Secondary | ICD-10-CM

## 2023-09-22 DIAGNOSIS — Z1589 Genetic susceptibility to other disease: Secondary | ICD-10-CM | POA: Diagnosis not present

## 2023-09-22 DIAGNOSIS — M791 Myalgia, unspecified site: Secondary | ICD-10-CM | POA: Diagnosis not present

## 2023-09-22 DIAGNOSIS — R768 Other specified abnormal immunological findings in serum: Secondary | ICD-10-CM | POA: Diagnosis not present

## 2023-09-22 DIAGNOSIS — M79643 Pain in unspecified hand: Secondary | ICD-10-CM | POA: Diagnosis not present

## 2023-09-27 ENCOUNTER — Other Ambulatory Visit: Payer: Self-pay | Admitting: Rheumatology

## 2023-09-27 DIAGNOSIS — M461 Sacroiliitis, not elsewhere classified: Secondary | ICD-10-CM

## 2023-09-28 ENCOUNTER — Encounter: Payer: Self-pay | Admitting: Psychiatry

## 2023-09-28 ENCOUNTER — Ambulatory Visit: Payer: BC Managed Care – PPO | Admitting: Psychiatry

## 2023-09-28 DIAGNOSIS — F319 Bipolar disorder, unspecified: Secondary | ICD-10-CM

## 2023-09-28 MED ORDER — CARIPRAZINE HCL 1.5 MG PO CAPS: 1.5000 mg | ORAL_CAPSULE | Freq: Every day | ORAL | 1 refills | Status: DC

## 2023-09-28 NOTE — Progress Notes (Signed)
 [name] [id number] 04-13-2001 23 y.o.   Subjective:   Patient ID:  Kim [name] is a 23 y.o. (DOB 2001/06/24) female.  Chief Complaint:  Chief Complaint  Patient presents with   Follow-up   Depression   Anxiety   Sleeping Problem    HPI DALEXA GENTZ presents today for follow-up of OCD, ADD, depression  02/20/20 appt with the following noted: Seen with mother [name] and father [name].  Rob needing psych help but gave up his appt for daughter to be seen before going to school. She's going to [place] to school. She has OCD, ADD, Tourette's and depression.  They wonder about other med changes. She needs liquids bc anxiety swallowing pills. Pt doesn't drive DT anxiety. Would like help with OCD.  Hesitant about ADD meds but poor tolerance with anxiety. Contamination fears with handwashing.  Germs in general and fear of vomtiing so hard to take pills. Handwashes time hard to say but a couple of minutes.  Irritating time.   Triggers include cleaning.   She will avoid hospitals and in general doctors offices. Mo concerned OCD takes a lot of time and attention. She recognizes OCD started in middle school. Parents in support of her going to school Depressed but not severe.  Not a direct consequence of OCD.  Feels exhausted and not motivated. Sleep about 9 hours.  Energy OK.  Not much to concentrate on.  Hard to read. ADD lack of focus is a problem on reading and is a concern for school.  Had racing heart before stimulants and they made it worse. Can get this with anxiety and get dizzy. Leaves for school 8/31. [name] first year.  In [place].    03/07/20 appt with the following noted: Abilify 5 definitely helped with depression. Dep 3/10.   More active.  Better productivity.  Better mood. Some difference with OCD.  Not as big improvement as with depression but better.  Not sure about time spent with OCD.  Anxiety generally 5/10/  Worst times 8/10.   Biggest concern  about OCD and school is the potential time involved.  Went time to do school work sometimes hard to resist OCD.  Not very concerned about OCD affecting her socially.   SE: Some hunger at times.  Restlessness fine now.   Plan: Continue Lexapro at current dose Increase Abilify to 7-8 mg now and if improved then no higher. If no improvement then increase to 10 mg daily.   06/25/2020 appointment with the following noted: Mostly good but not much improvement with OCD.  Depression changing like usual.  Weather affects her and other factors.  History of seasonal depression.  More tired and sleep more.  Annoying but not a huge deal.   Increase in Abilify without sig change.   Finishes exams Thursday and break for a month. No SE.   Able to enjoy things. Handwashing 5 min each time she washes them.  Hard to know how much total time. Tremor before Lexapro.    Plan: Continue Lexapro but increase to 35 mg daily for 2 weeks, then 40 mg daily. High dose medically necessary.  Needs liquid and couldn't tolerate the sertraline. After exams drop Abilify back to 5 mg bc not better with increase to 10 mg.  08/12/2020 telephone call with the following noted: Jenafer was taken off of Abilify and had her Lexapro increased. Since doing this she is feeling worse, more depressed and lack of energy. She would like  to know what she needs to do to help with this MD response: Her last appointment was 06/25/2020.  Prior to that appointment we had increased Abilify from 5 mg to 10 mg without additional improvement.  Therefore the instructions were to reduce the Abilify back to 5 mg daily.  She was not instructed to discontinue it. Because the increase in Abilify had not been helpful from 5 to 10 mg, in addition to reducing the Abilify back to 5 mg she was to increase the Lexapro from 30 mg to 40 mg. It is unlikely that increasing Lexapro would make her more depressed.  I think the depression is worse because she stopped the Abilify  apparently.  The increase in Lexapro could possibly make her more fatigued but worsening depression could be causing the fatigue.  The benefit from restarting Abilify 5 mg daily should be quick, that is benefit within a week. Therefore I suggest she continue the current dose of Lexapro and add 5 mg of Abilify back.  The depression should improve within a week or so.  If she is not better by February 10 then call us back  11/10/2020 appt noted: Stayed 1 semester at [employer] not a good fit and left.  Goiing to [employer] in the fall. Felt worse off the Abilify and restarted at 5 mg 08/12/20 and felt better energy. Doing pretty good, pretty busy.  Couple of classes at [employer] just to get some credit.s OCD is about the same without much change with increase in Lexapro.  No SE noted. Anxiety is fairly high usually associated with obsessive thoughts. Not too much depression. Sleep regular. NO SE.   Not regular Zofran but occ and not sure what causes the nausea. Plan: Reduce Lexapro to 20 ml and add 5 ml fluoxetine for 1 week, Then reduce Lexapro to 10 ml and increase fluoxetine to 10 ml for 1 week, Then stop Lexapro and increase fluoxetine to 15 ml daily  02/16/2021 appointment with the following noted:  Seen with DAD Switched to fluoxetine 15 ml and energy and motivation and mood is better.  Anxiety is a little better but not b much.  OCD is about the same. No problems with the switch in meds.   Hard to tell how much time OCD takes bc she can mutifunciton while does compulsions. Avoids contaminants and handwashes but can work at [employer]. No SE.   Not really depressed.   Takes Zofran intermittently a couple times per week can be triggered with caffeine. Father says she's seemed to be doing well and excited about school. Going to [employer] to [employer]. Less tremor. Plan: Option increase fluoxetine to 80 mg daily for OCD.  She'd like to try  it.  07/01/2021 appointment with the following noted: Didn't increase bc fluoxetine dose is the same. 60 mg daily. Been doing fairly well except for when off for several weeks.  When on it I feel good. No SE Don't like the quillivant XR bc triggers intrusive SI and when coming off has depression and SI.  Not had this with other stimulants.  Takes it rarely. Still on aripiprazole 5 ml daily. No other SE. Mildly depressed.   Can have a good portion of time with OCD but doesn't always interfere with other things. Still nausea and not sure why.  Not caused by meds. When off Prozac grades went down. Goes back to school Jan 11.  01/04/2022 appointment with the following noted:  Psychiatric hospitalization 11/06/2021 until 11/12/2021 at [facility] in [place] for depression with suicidal ideation after drinking alcohol with a bottle of cough syrup and use of marijuana as well as noncompliance with psychiatric medications. Prescribed Abilify 5 mg daily along with fluoxetine 40 mg daily She's at home. She doesn't remember much about how she was doing then.  She had stopped meds a month or 2 before hosp but not sure why.  Before hosp was a few drinks 4 nights a week.  With drew from 1 class and passed the other 2.  Was smoking marijuana not daily but soemtimes multiple times per day.  Sometimes missed classes from oversleeping.  No opiates.   I think I might be bipolar bc was careening back and forth emotionally. Didn't sleep last night. Done some research on bipolar 2 including hyperactivity, with bursts of creativity and not sleeping. Also hypertalkative and hypersexual and risk taking and grandiose thinking.  Upswings might last days.  Mixed states also. Distinct depressions might last weeks.  Not much normal mood. Been at beach with friend last week.  No comments from family or friend. F thinks she might be bipolar. Has compulsions but not anxious about it. Handwashing a lot less than in the past. No  panic and no avoidance. Had anxiety about classes bc wasn't prepared enough. Swallowing is better now. Nausea for years without pattern. Consistent with meds. Consistent with Abilify 5 and fluoxetine 80 Plan: She elects to increase Abilify to 10 mg daily.  01/22/22 appt noted: Increase Abilify 10 mg daily with SE mouth tics and reduced to 5 mg daily.  No one commented.  Moving around jaw would cause some pain soreness..  better on lower dose re: SE. Reduced just recently. Mood has been high and probably hypomanic or manic. Sometimes hears people knock on window or hears her name.  Weird. Staying up late and some EMA.  Avg 4-6 hours per night.  Some nights no sleep.   Mood and confidence and energy increase.  Always a bad spender, some increased risk taking behavior.  More talkative. Anxiety has been alright and not as bad as it was.   Nausea but no other sx physically. Plan: Trileptal and increase to 150 mg every morning and 300 mg nightly.   Reduced fluxoeine to 60 DT rapid cycling Reduced Abilify to 5 mg daily. Continue Vyvanse 30  02/12/2022 appointment with the following noted: Not taking Trilepotal, stopped Trileptal DT increase SI so stopped.  Took it a few days. Still having some SI.  Not all the time but not too strong bc no plan.  If sees knife will have the thought.   Off Abilify. Has been depressed since after vacation.  Low energy and mood, little motivation.  Neg self talk. No irritability or anger. Sleep too much 10+ hours.  No napping. Not doing much.   At the beach was feeling fine.   Main stress, boredom and loneliness. Planning to return to school and feels capable.  Benefit structure and friends. Not much anxiety or OCD. Mouth tics resolved. Persistent N and lost 10# bc hard to eat. Plan: Start Lithobid 300 mg nightly x 4 days then 600 mg nightly.  03/19/22 appt noted: Sharing apt. Good with room mates. Start Lithobid 300 mg nightly x 4 days then 600 mg nightly.   No effect noted. No SI Up and down with downs more noticeable.  Poor concept of time.  But days duration. With lows doesn't want to  do anything. Only missed 2 classes.  Lays around when depressed. Sometimes can tell some mania and high energy and want to do so much stuff.Marland Kitchen  around the end of last week lasting 4-5 days. Not suire about mood now, can't identify how I'm feeling. Seeing out people and don't want to be alone.  Wouldn't say I'm not depressed. Not much anxiety. OCD not bad at all Rare trazodone. Worked with some hangover. nNausea is about the same. No cause known and has appt with doctor. Mouth tics much better off Abilify Sleep irregular from 4-12 hours.  Sometimes don't feel like sleeping.    04/21/22 appt noted: Taking lithium 600 mg daily. Missed Vyvanse and N is better.  Did help with ADD. Sleep is better but more than she would like. Willing to try increasing the lithium. She wants help with ADD but has not tolerated stimulants very well.  She is keeping up with class work for the most part and is not missing classes generally.  She needs to sleep more than she would like about 10 hours.  No major mood swings.  No suicidal thoughts since she was last here.  Anxiety is manageable but she does feel somewhat stressed by her schoolwork. Plan: Still rec increase  Lithobid 900 mg nightly. Check lithium level.  09/22/22 appt noted: Made changes noted above.  So much better with change to daylight savings time.  But has had a lot of depression and some SI.  Really bad Nov-Dec .  Didn't go back to college.  Not good Jan & Feb.  March better with spring but not as good as she would like.  SI as recent as last week.  Can be triggered if feeling unwanted in social situations.  But can be triggered without reason.   Dep better on spring break with friends lately.  Overall dep 6-7/10 and anxiety 4/10 lately. No sig manic sx since here.   No sig SE.    Plan: Due to rapid cycling we have  reduced the fluoxetine to 40 mg daily. Took 60 for 7 weeks.   Increase fluoxetine to 60 mg daily for depression. Continue lihtium 900, modafinil 100-200 mg AM prn ADD sx., trazodone 50 mg HS prn  09/29/22 TC:  Patient was last seen on 3/13 and her fluoxetine dose was increased. She is reporting frequent mood changes, said she was probably hypomanic, now describing increased depression, having anger outbursts which she said is not like her. She reports intermittent awakening at night, but is able to go back to sleep.    She also had an issue with not being able to get her modafinil.     MD resp: We need to stop the mood swings as top priority bc the depression will not be controlled as long as having mood swings.  Lithium should be preventing the mood swings but is not and that may be due to blood level being too low.  I have requested her to get a blood level sometime ago but I have never received 1.  In order to speed the process up have her increase the lithium to 4 tablets daily and get a blood level in a week.  The day before the blood test take the lithium as normal and get the blood test approximately 12 hours after the last dose of lithium.  It is very important that she get that level so we can determine whether lithium is going to work for her or not.  But she needs to wait 5 to 7 days after increasing the lithium to 4 tablets daily before getting the blood level. Ill send lihtium level order to [facility].     10/25/22 TC:  Pt called 1:04pm today. [facility] and they took bloodwork on her but think she had lithium toxicity. She went to [employer] in Clinic. She was taking 1200mg  of lithium right now. She is not sure what to do and lab results haven't come back yet. (Shakiness, Unsteady, Dizzy, cloudy, Vision blurred, stomach upset , fatigued, body aches)    MD resp:  RTC  Lithium level 1.1.  stopped a couple of days ago.  Complaining of sx starting Thursday.    No change in sx or meds.  Was feeling fatigued.   Drinking fluids now.   Stopped lithium Saturday.   Stay off lithium for a couple of days and then call back when sx resolve.  No other changes.   No SI lately.    She agrees. Meredith Staggers, MD, DFAPA     11/02/22 appt noted: Stopped lithium on weekend.  Restarted at 300 mg daily. Feels a little shakey, nausea, mild dizzy feeling.  Mild tremor. No med changes or additions last week.  Eating and drinking normally. Over the last month mood is pretty good overall.   No SI in the last month. She has felt physically anxious and can't place why.  Not much OCD right now. More energy and focus in the day with modafinil. No NSAIDs. Lithium does seem to work and would like to continue it.   Had some tremor before lithium.  Uncertain if family hisotry of it. Plan; no changes  11/12/22 appt noted: Overall N is better and less Zofran. Propranolol prn anxiety Normal amount of tremor and nausea.  No longer unsteady.   Mentally ok.   Noel Christmas living with them and that has helped mood. Uncertain future.  Goal is to save up enough for apt.  But friend may be there a long while.  Met first year college of [employer]. Not lonely or bored all the time and those are the things that really get me.   No SI since here. Nothing terribly excessive anxiety but some residual OCD is the main source of the anxiety. No SE now.   Asks about increasing lithium. Plan: Modafinil off label DT poor tolerance stimulants, modafinil 100 mg AM.    03/24/23 appt noted:  goes by [facility] since here Meds: fluoxetine 60 mg for couple mos, modafinil 200 mg AM, lithobid 600 Modafinil helped focus and attention.   No SE Increased fluoxetine to 60. Not bad mood but disconnected. No usual stress.   At home.  Starting new job FT early October.  Excited. Cut leg since here and had to get stitches middle August.  Dont' remember how she was feeling at the time.  No SI.  No  sig SI since here.  Losing time and can't recall wht she did yesterday.  Alcohol 3-4 days per week 2-3 beers and on weekend more.  Occ THC. No other drugs.   No NM.  Weird dreams and thinks their repetitive.  Sleep about 9 hours.   Not much obsessive thinking. No sig trauma history.  Not much other anxiety.   N is better. Plan retry Wellbutrin with other meds for mood , anhedonia  07/28/23 appt noted: with mother, [name] Psych meds: stopped Wellbutrin XL 300 every morning,  incre fluoxetine 80 daily, Lithobid 600 daily, off modafinil 200 every morning bc didn't get a RF, propranolol 10 mg 1-2 twice daily as needed anxiety or tremor. "Jude" reports stopped Wellbutrin DT N, dizziness, sleepiness. Last month incr fluoxetine to 80 mg on her own for depression.  Winter typically low energy.  Affects function.   Also depressed.  Not much effect with increase fluoxetine. No current SE. Having joint pain all the time.  Esp if in one position too long.   Sleep 9-10 hours interrupted.  Sleep more in winter.   M notes for several months withdrawn and hard to converse.  Will isolate in her room.  Sees her with hopelessness.   Discouraged over not finding a job.  Lack of energy and motivation.  No self harm.   F benefit pramipexole. Plan: continue meds.  Resume modafinil. Vraylar 1.5 mg daily for bipolar depression.  samples  08/24/23 appt noted: Psych med: fluox 60, lithobid 600 HS, modafinil 200 AM, propranolol 10-20 mg BID prn , Vraylar 1.5 mg daily. Higher energy with Vraylar.  More productive.  Better mood. Seasonal dep.  Hard to say if out of depression therefeore. No SE with Vraylar. Less sleep to 7-8 hours.  Some awakening with pain.  Not drowsy.   Satisfied with meds now but ready for Spring. Plan no changes  09/28/23 appt noted: Med: fluox 60, lithobid 600 HS, modafinil 200 AM, propranolol 10-20 mg BID prn , OUT Vraylar 1.5 mg daily for a few weeks Has felt kind of numb lately.   Annoying.  Harder to focus and motivate without it.   No SE with Vraylar.   Sleep inconsistent either too much or too lately.  Kind of cyclical . No sig anxiety. Applying for jobs locally.    No current Therapist Anne Shutter, Journalist, newspaper. Previous Eliott Nine without help.  GF OCD. F bipolar lithium with benefit.  Past Psychiatric Medication Trials: History of Tourette's, ADD, OCD History of ADD brief trials Adderall XR 15, guanfacine ER 1 tired, methylphenidate, Strattera Quillevent SI Vyvanse N Modafinil 100 tolerated. Lithium 900 tolerated; 1200 SE Lexapro 40, sertraline liquid brief hard to swallow,   Fluoxetine 60 better mood Wellbutrin SE mirtazapine,   Abilify 10 EPS "mouth tics" & no better than 5 which helped. Trileptal brief increase SI  Psychiatric hospitalization 11/06/2021 until 11/12/2021 at [facility] in [place] for depression with suicidal ideation after drinking alcohol with a bottle of cough syrup and use of marijuana as well as noncompliance with psychiatric medications. Prescribed Abilify 5 mg daily along with fluoxetine 40 mg daily. Had EPS with increased Abilify to 10 mg daily.   Stopped DT "mouth tics".  Review of Systems:  Review of Systems  Cardiovascular:  Negative for palpitations.  Gastrointestinal:  Negative for nausea.  Musculoskeletal:  Positive for arthralgias.  Neurological:  Negative for tremors.  Psychiatric/Behavioral:  Positive for dysphoric mood. Negative for sleep disturbance.     Medications: I have reviewed the patient's current medications.  Current Outpatient Medications  Medication Sig Dispense Refill   FLUoxetine (PROZAC) 20 MG capsule Take 3 capsules (60 mg total) by mouth daily. 270 capsule 1   lithium carbonate (LITHOBID) 300 MG ER tablet Take 2 tablets (600 mg total) by mouth daily. 180 tablet 0   modafinil (PROVIGIL) 200 MG tablet TAKE 1 EVERY DAY IN THE MORNING **PA DENIED** 30 tablet 2   ondansetron (ZOFRAN-ODT) 8 MG  disintegrating tablet TAKE 1 TABLET BY MOUTH EVERY 8 HOURS  AS NEEDED FOR NAUSEA AND VOMITING 20 tablet 0   propranolol (INDERAL) 10 MG tablet TAKE 1 TO 2 TABLETS BY MOUTH TWICE A DAY AS NEEDED FOR ANXIETY OR TREMOR 360 tablet 0   cariprazine (VRAYLAR) 1.5 MG capsule Take 1 capsule (1.5 mg total) by mouth daily. 90 capsule 1   traZODone (DESYREL) 50 MG tablet Take 1 tablet (50 mg total) by mouth at bedtime as needed for sleep. (Patient not taking: Reported on 09/28/2023) 30 tablet 0   No current facility-administered medications for this visit.    Medication Side Effects: None  Allergies: No Known Allergies  Past Medical History:  Diagnosis Date   Constipation    GERD (gastroesophageal reflux disease)    Granuloma of skin    on forehead    Family History  Problem Relation Age of Onset   GER disease Father    Ulcerative colitis Father    ADD / ADHD Father    Anxiety disorder Father    Depression Father    Bipolar disorder Father    Migraines Paternal Grandmother    Anxiety disorder Paternal Grandmother    Depression Paternal Grandmother    Seizures Neg Hx    Autism Neg Hx    Schizophrenia Neg Hx     Social History   Socioeconomic History   Marital status: Single    Spouse name: n/a   Number of children: 0   Years of education: Not on file   Highest education level: Not on file  Occupational History   Occupation: student  Tobacco Use   Smoking status: Never   Smokeless tobacco: Never  Vaping Use   Vaping status: Never Used  Substance and Sexual Activity   Alcohol use: No   Drug use: No   Sexual activity: Never  Other Topics Concern   Not on file  Social History Narrative   Lives with mom, dad, brother in the same house. She is in the 11th grade at [employer]. Does well in school. She enjoys listening to music, writing and singing.    Social Drivers of Corporate investment banker Strain: Not on file  Food Insecurity: Not on file  Transportation Needs: Not  on file  Physical Activity: Not on file  Stress: Not on file  Social Connections: Unknown (02/24/2023)   [facility]   Social Network    Social Network: Not on file  Intimate Partner Violence: Not At Risk (02/24/2023)   Received from [facility]   HITS    Over the last 12 months how often did your partner physically hurt you?: Never    Over the last 12 months how often did your partner insult you or talk down to you?: Never    Over the last 12 months how often did your partner threaten you with physical harm?: Never    Over the last 12 months how often did your partner scream or curse at you?: Never    Past Medical History, Surgical history, Social history, and Family history were reviewed and updated as appropriate.   Please see review of systems for further details on the patient's review from today.   Objective:   Physical Exam:  There were no vitals taken for this visit.  Physical Exam Constitutional:      General: She is not in acute distress.    Appearance: She is well-developed.  Musculoskeletal:        General: No deformity.  Neurological:  Mental Status: She is alert and oriented to person, place, and time.     Cranial Nerves: No dysarthria.     Coordination: Coordination normal.  Psychiatric:        Attention and Perception: Attention and perception normal.        Mood and Affect: Mood is depressed. Mood is not anxious. Affect is not labile, blunt, angry or inappropriate.        Speech: Speech normal.        Behavior: Behavior normal. Behavior is cooperative.        Thought Content: Thought content normal. Thought content is not paranoid or delusional. Thought content does not include homicidal or suicidal ideation. Thought content does not include suicidal plan.        Cognition and Memory: Cognition and memory normal.        Judgment: Judgment normal.     Comments: Insight intact Anxiety manageable  Depression more anhedonia off Vraylar   Fidgety resolved mostly     Lab Review:  No results found for: "NA", "K", "CL", "CO2", "GLUCOSE", "BUN", "CREATININE", "CALCIUM", "PROT", "ALBUMIN", "AST", "ALT", "ALKPHOS", "BILITOT", "GFRNONAA", "GFRAA"  No results found for: "WBC", "RBC", "HGB", "HCT", "PLT", "MCV", "MCH", "MCHC", "RDW", "LYMPHSABS", "MONOABS", "EOSABS", "BASOSABS"  No results found for: "POCLITH", "LITHIUM"   No results found for: "PHENYTOIN", "PHENOBARB", "VALPROATE", "CBMZ"   Review [facility] testing dated September 30, 2017.  SLC 6 A4 was long long.  HDR2A was normal genotype. Pharmacokinetic genes included normal 2D6, 3 A4, 2 C9, 2C19, 1A2, and intermediate 2B 6  10/23/22 Lithium level 1.1. while taking 1200 mg day but had   .res Assessment: Plan:    Tacori "Jude" was seen today for follow-up, depression, anxiety and sleeping problem.  Diagnoses and all orders for this visit:  Bipolar affective disorder, rapid cycling (HCC) -     cariprazine (VRAYLAR) 1.5 MG capsule; Take 1 capsule (1.5 mg total) by mouth daily.   30 min face to face time with patient was spent on counseling and coordination of care.   SX overall under fair control but flatter out of Vraylar.  Discussed her concerns that she may have bipolar disorder type II, recent hypomania.  She has done some reading on the subject.  That appears to be accurate.  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  We discussed how the presence of SSRIs used for her OCD may increase the risk of rapid cycling.  However there is not a good alternative to SSRIs for OCD. Extensive discussion about alternative types of mood stabilizers including lithium, Depakote and atypical antipsychotics.  Lithium is not reliably effective for rapid cycling but father is bipolar with benefit from lithium.  We discussed the option of Depakote but not ideal in woman of child bearing age but we may have little other options DT apprent SE antipsychotic.    Due to the  suicidal thoughts lithium was a good choice.  Her father also responded to lithium.  Informed her we could go up in the dose if she starts developing manic symptoms.  We discussed again the importance of the mood stabilizer.  We will use Lithobid because of nausea problems. Icontinue lower dose  Lithobid 600 mg nightly  Repeat lithium level.  Counseled patient regarding potential benefits, risks, and side effects of lithium to include potential risk of lithium affecting thyroid and renal function.  Discussed need for periodic lab monitoring to determine drug level and to assess for potential  adverse effects.  Counseled patient regarding signs and symptoms of lithium toxicity and advised that they notify office immediately or seek urgent medical attention if experiencing these signs and symptoms.  Patient advised to contact office with any questions or concerns. sx lithium toxicity resolved.  For mood  fluoxetine to 60 mg daily for depression and she's continued it.      She has learned to swallow capsules while at the hospital.  We discussed the risk of worsening OCD at the lower dose  Coffee worsens tremor discussed and she's aware.  Resumed Modafinil off label DT poor tolerance stimulants, modafinil 100-200 mg AM.  Off label for ADD bc N with Vyvanse and poor mood tolerance. Tolerating this well. And it helped.   Ran out of Vraylar 1.5 mg daily for bipolar depression.  Will restart it today bc prior benefit of dep Discussed potential metabolic side effects associated with atypical antipsychotics, as well as potential risk for movement side effects. Advised pt to contact office if movement side effects occur.  This has the potential of being monotherapy.  Disc other options.   Discussed the importance of avoiding marijuana and alcohol abuse at this time  We discussed her father's psychiatric meds and the potential for use of lamotrigine.  We will we will defer this choice at this time in order  to keep medications as simple as possible  vitamin D 4000-5000 units daily in winter.  Disc counseling  Consider lamotrigine for seasonal depression  Call if symptoms worsen or she has significant side effects.  FU 1 mos  Meredith Staggers, MD, DFAPA  Please see After Visit Summary for patient specific instructions.  Future Appointments  Date Time Provider Department Center  11/01/2023 10:30 AM Cottle, Steva Ready., MD [facility] None  12/08/2023  1:00 PM Cottle, Steva Ready., MD [facility] None          No orders of the defined types were placed in this encounter.    -------------------------------

## 2023-10-21 ENCOUNTER — Ambulatory Visit
Admission: RE | Admit: 2023-10-21 | Discharge: 2023-10-21 | Disposition: A | Discharge status: Home / Self Care | Source: Ambulatory Visit | Attending: Rheumatology | Admitting: Rheumatology

## 2023-10-21 DIAGNOSIS — M5440 Lumbago with sciatica, unspecified side: Secondary | ICD-10-CM | POA: Diagnosis not present

## 2023-10-21 DIAGNOSIS — M461 Sacroiliitis, not elsewhere classified: Secondary | ICD-10-CM

## 2023-11-01 ENCOUNTER — Ambulatory Visit (INDEPENDENT_AMBULATORY_CARE_PROVIDER_SITE_OTHER): Payer: BC Managed Care – PPO | Admitting: Psychiatry

## 2023-11-01 DIAGNOSIS — Z91199 Patient's noncompliance with other medical treatment and regimen due to unspecified reason: Secondary | ICD-10-CM

## 2023-11-01 NOTE — Progress Notes (Signed)
 No show

## 2023-11-27 ENCOUNTER — Other Ambulatory Visit: Payer: Self-pay | Admitting: Psychiatry

## 2023-11-27 DIAGNOSIS — F319 Bipolar disorder, unspecified: Secondary | ICD-10-CM

## 2023-12-06 DIAGNOSIS — S92352A Displaced fracture of fifth metatarsal bone, left foot, initial encounter for closed fracture: Secondary | ICD-10-CM | POA: Diagnosis not present

## 2023-12-08 ENCOUNTER — Ambulatory Visit: Admitting: Psychiatry

## 2023-12-08 ENCOUNTER — Encounter: Payer: Self-pay | Admitting: Psychiatry

## 2023-12-08 DIAGNOSIS — F422 Mixed obsessional thoughts and acts: Secondary | ICD-10-CM

## 2023-12-08 DIAGNOSIS — F952 Tourette's disorder: Secondary | ICD-10-CM | POA: Diagnosis not present

## 2023-12-08 DIAGNOSIS — F319 Bipolar disorder, unspecified: Secondary | ICD-10-CM | POA: Diagnosis not present

## 2023-12-08 DIAGNOSIS — F902 Attention-deficit hyperactivity disorder, combined type: Secondary | ICD-10-CM

## 2023-12-08 DIAGNOSIS — Z79899 Other long term (current) drug therapy: Secondary | ICD-10-CM

## 2023-12-08 MED ORDER — MODAFINIL 200 MG PO TABS: ORAL_TABLET | ORAL | 2 refills | Status: DC

## 2023-12-08 MED ORDER — FLUOXETINE HCL 20 MG PO CAPS: 60.0000 mg | ORAL_CAPSULE | Freq: Every day | ORAL | 1 refills | Status: DC

## 2023-12-08 MED ORDER — LITHIUM CARBONATE ER 300 MG PO TBCR: 600.0000 mg | EXTENDED_RELEASE_TABLET | Freq: Every day | ORAL | 1 refills | Status: AC

## 2023-12-08 NOTE — Progress Notes (Signed)
 Kim [name] [id number] 2001-02-08 23 y.o.   Subjective:   Patient ID:  Kim [name] is a 23 y.o. (DOB [birth year]) female.  Chief Complaint:  Chief Complaint  Patient presents with   Follow-up   Depression    HPI Kim [name] presents today for follow-up of OCD, ADD, depression  02/20/20 appt with the following noted: Seen with mother Kim [name] and father Kim [name].  Rob needing psych help but gave up his appt for daughter to be seen before going to school. She's going to [place]  to school. She has OCD, ADD, Tourette's and depression.  They wonder about other med changes. She needs liquids bc anxiety swallowing pills. Pt doesn't drive DT anxiety. Would like help with OCD.  Hesitant about ADD meds but poor tolerance with anxiety. Contamination fears with handwashing.  Germs in general and fear of vomtiing so hard to take pills. Handwashes time hard to say but a couple of minutes.  Irritating time.   Triggers include cleaning.   She will avoid hospitals and in general doctors offices. Mo concerned OCD takes a lot of time and attention. She recognizes OCD started in middle school. Parents in support of her going to school Depressed but not severe.  Not a direct consequence of OCD.  Feels exhausted and not motivated. Sleep about 9 hours.  Energy OK.  Not much to concentrate on.  Hard to read. ADD lack of focus is a problem on reading and is a concern for school.  Had racing heart before stimulants and they made it worse. Can get this with anxiety and get dizzy. Leaves for school 8/31. [name] first year.  In [place].    03/07/20 appt with the following noted: Abilify  5 definitely helped with depression. Dep 3/10.   More active.  Better productivity.  Better mood. Some difference with OCD.  Not as big improvement as with depression but better.  Not sure about time spent with OCD.  Anxiety generally 5/10/  Worst times 8/10.   Biggest concern about OCD and school is the  potential time involved.  Went time to do school work sometimes hard to resist OCD.  Not very concerned about OCD affecting her socially.   SE: Some hunger at times.  Restlessness fine now.   Plan: Continue Lexapro  at current dose Increase Abilify  to 7-8 mg now and if improved then no higher. If no improvement then increase to 10 mg daily.   06/25/2020 appointment with the following noted: Mostly good but not much improvement with OCD.  Depression changing like usual.  Weather affects her and other factors.  History of seasonal depression.  More tired and sleep more.  Annoying but not a huge deal.   Increase in Abilify  without sig change.   Finishes exams Thursday and break for a month. No SE.   Able to enjoy things. Handwashing 5 min each time she washes them.  Hard to know how much total time. Tremor before Lexapro .    Plan: Continue Lexapro  but increase to 35 mg daily for 2 weeks, then 40 mg daily. High dose medically necessary.  Needs liquid and couldn't tolerate the sertraline. After exams drop Abilify  back to 5 mg bc not better with increase to 10 mg.  08/12/2020 telephone call with the following noted: Imagene was taken off of Abilify  and had her Lexapro  increased. Since doing this she is feeling worse, more depressed and lack of energy. She would like to know what she needs to do  to help with this MD response: Her last appointment was 06/25/2020.  Prior to that appointment we had increased Abilify  from 5 mg to 10 mg without additional improvement.  Therefore the instructions were to reduce the Abilify  back to 5 mg daily.  She was not instructed to discontinue it. Because the increase in Abilify  had not been helpful from 5 to 10 mg, in addition to reducing the Abilify  back to 5 mg she was to increase the Lexapro  from 30 mg to 40 mg. It is unlikely that increasing Lexapro  would make her more depressed.  I think the depression is worse because she stopped the Abilify  apparently.  The increase in  Lexapro  could possibly make her more fatigued but worsening depression could be causing the fatigue.  The benefit from restarting Abilify  5 mg daily should be quick, that is benefit within a week. Therefore I suggest she continue the current dose of Lexapro  and add 5 mg of Abilify  back.  The depression should improve within a week or so.  If she is not better by February 10 then call us  back  11/10/2020 appt noted: Stayed 1 semester at [employer] not a good fit and left.  Goiing to [employer] in the fall. Felt worse off the Abilify  and restarted at 5 mg 08/12/20 and felt better energy. Doing pretty good, pretty busy.  Couple of classes at [employer] just to get some credit.s OCD is about the same without much change with increase in Lexapro .  No SE noted. Anxiety is fairly high usually associated with obsessive thoughts. Not too much depression. Sleep regular. NO SE.   Not regular Zofran  but occ and not sure what causes the nausea. Plan: Reduce Lexapro  to 20 ml and add 5 ml fluoxetine  for 1 week, Then reduce Lexapro  to 10 ml and increase fluoxetine  to 10 ml for 1 week, Then stop Lexapro  and increase fluoxetine  to 15 ml daily  02/16/2021 appointment with the following noted:  Seen with DAD Switched to fluoxetine  15 ml and energy and motivation and mood is better.  Anxiety is a little better but not b much.  OCD is about the same. No problems with the switch in meds.   Hard to tell how much time OCD takes bc she can mutifunciton while does compulsions. Avoids contaminants and handwashes but can work at [employer]. No SE.   Not really depressed.   Takes Zofran  intermittently a couple times per week can be triggered with caffeine. Father says she's seemed to be doing well and excited about school. Going to [employer] to [employer]. Less tremor. Plan: Option increase fluoxetine  to 80 mg daily for OCD.  She'd like to try it.  07/01/2021 appointment with the  following noted: Didn't increase bc fluoxetine  dose is the same. 60 mg daily. Been doing fairly well except for when off for several weeks.  When on it I feel good. No SE Don't like the quillivant  XR bc triggers intrusive SI and when coming off has depression and SI.  Not had this with other stimulants.  Takes it rarely. Still on aripiprazole  5 ml daily. No other SE. Mildly depressed.   Can have a good portion of time with OCD but doesn't always interfere with other things. Still nausea and not sure why.  Not caused by meds. When off Prozac  grades went down. Goes back to school Jan 11.  01/04/2022 appointment with the following noted: Psychiatric hospitalization 11/06/2021 until 11/12/2021 at [facility] in [place] for depression with suicidal ideation after drinking alcohol with a bottle of cough syrup and use of marijuana as well as noncompliance with psychiatric medications. Prescribed Abilify  5 mg daily along with fluoxetine  40 mg daily She's at home. She doesn't remember much about how she was doing then.  She had stopped meds a month or 2 before hosp but not sure why.  Before hosp was a few drinks 4 nights a week.  With drew from 1 class and passed the other 2.  Was smoking marijuana not daily but soemtimes multiple times per day.  Sometimes missed classes from oversleeping.  No opiates.   I think I might be bipolar bc was careening back and forth emotionally. Didn't sleep last night. Done some research on bipolar 2 including hyperactivity, with bursts of creativity and not sleeping. Also hypertalkative and hypersexual and risk taking and grandiose thinking.  Upswings might last days.  Mixed states also. Distinct depressions might last weeks.  Not much normal mood. Been at beach with friend last week.  No comments from family or friend. F thinks she might be bipolar. Has compulsions but not anxious about it. Handwashing a lot less than in the past. No panic and no avoidance. Had anxiety  about classes bc wasn't prepared enough. Swallowing is better now. Nausea for years without pattern. Consistent with meds. Consistent with Abilify  5 and fluoxetine  80 Plan: She elects to increase Abilify  to 10 mg daily.  01/22/22 appt noted: Increase Abilify  10 mg daily with SE mouth tics and reduced to 5 mg daily.  No one commented.  Moving around jaw would cause some pain soreness..  better on lower dose re: SE. Reduced just recently. Mood has been high and probably hypomanic or manic. Sometimes hears people knock on window or hears her name.  Weird. Staying up late and some EMA.  Avg 4-6 hours per night.  Some nights no sleep.   Mood and confidence and energy increase.  Always a bad spender, some increased risk taking behavior.  More talkative. Anxiety has been alright and not as bad as it was.   Nausea but no other sx physically. Plan: Trileptal  and increase to 150 mg every morning and 300 mg nightly.   Reduced fluxoeine to 60 DT rapid cycling Reduced Abilify  to 5 mg daily. Continue Vyvanse  30  02/12/2022 appointment with the following noted: Not taking Trilepotal, stopped Trileptal  DT increase SI so stopped.  Took it a few days. Still having some SI.  Not all the time but not too strong bc no plan.  If sees knife will have the thought.   Off Abilify . Has been depressed since after vacation.  Low energy and mood, little motivation.  Neg self talk. No irritability or anger. Sleep too much 10+ hours.  No napping. Not doing much.   At the beach was feeling fine.   Main stress, boredom and loneliness. Planning to return to school and feels capable.  Benefit structure and friends. Not much anxiety or OCD. Mouth tics resolved. Persistent N and lost 10# bc hard to eat. Plan: Start Lithobid  300 mg nightly x 4 days then 600 mg nightly.  03/19/22 appt noted: Sharing apt. Good with room mates. Start Lithobid  300 mg nightly x 4 days then 600 mg nightly.  No effect noted. No SI Up and down  with downs more noticeable.  Poor concept of time.  But days duration. With lows doesn't want to do anything. Only missed 2 classes.  Lays around when depressed. Sometimes can tell some mania and high energy and want to do so much stuff.Kim [name]  around the end of last week lasting 4-5 days. Not suire about mood now, can't identify how I'm feeling. Seeing out people and don't want to be alone.  Wouldn't say I'm not depressed. Not much anxiety. OCD not bad at all Rare trazodone . Worked with some hangover. nNausea is about the same. No cause known and has appt with doctor. Mouth tics much better off Abilify  Sleep irregular from 4-12 hours.  Sometimes don't feel like sleeping.    04/21/22 appt noted: Taking lithium  600 mg daily. Missed Vyvanse  and N is better.  Did help with ADD. Sleep is better but more than she would like. Willing to try increasing the lithium . She wants help with ADD but has not tolerated stimulants very well.  She is keeping up with class work for the most part and is not missing classes generally.  She needs to sleep more than she would like about 10 hours.  No major mood swings.  No suicidal thoughts since she was last here.  Anxiety is manageable but she does feel somewhat stressed by her schoolwork. Plan: Still rec increase  Lithobid  900 mg nightly. Check lithium  level.  09/22/22 appt noted: Made changes noted above.  So much better with change to daylight savings time.  But has had a lot of depression and some SI.  Really bad Nov-Dec .  Didn't go back to college.  Not good Jan & Feb.  March better with spring but not as good as she would like.  SI as recent as last week.  Can be triggered if feeling unwanted in social situations.  But can be triggered without reason.   Dep better on spring break with friends lately.  Overall dep 6-7/10 and anxiety 4/10 lately. No sig manic sx since here.   No sig SE.    Plan: Due to rapid cycling we have reduced the fluoxetine  to 40 mg daily.  Took 60 for 7 weeks.   Increase fluoxetine  to 60 mg daily for depression. Continue lihtium 900, modafinil  100-200 mg AM prn ADD sx., trazodone  50 mg HS prn  09/29/22 TC:  Patient was last seen on 3/13 and her fluoxetine  dose was increased. She is reporting frequent mood changes, said she was probably hypomanic, now describing increased depression, having anger outbursts which she said is not like her. She reports intermittent awakening at night, but is able to go back to sleep.    She also had an issue with not being able to get her modafinil .     MD resp: We need to stop the mood swings as top priority bc the depression will not be controlled as long as having mood swings.  Lithium  should be preventing the mood swings but is not and that may be due to blood level being too low.  I have requested her to get a blood level sometime ago but I have never received 1.  In order to speed the process up have her increase the lithium  to 4 tablets daily and get a blood level in a week.  The day before the blood test take the lithium  as normal and get the blood test approximately 12 hours after the last dose of lithium .  It is very important that she get that level so we can determine whether lithium  is going to work for her or not.  But she needs to wait 5 to  7 days after increasing the lithium  to 4 tablets daily before getting the blood level. Ill send lihtium level order to [facility].     10/25/22 TC:  Pt called 1:04pm today. [facility] and they took bloodwork on her but think she had lithium  toxicity. She went to [employer] in Clinic. She was taking 1200mg  of lithium  right now. She is not sure what to do and lab results haven't come back yet. (Shakiness, Unsteady, Dizzy, cloudy, Vision blurred, stomach upset , fatigued, body aches)    MD resp:  RTC  Lithium  level 1.1.  stopped a couple of days ago.  Complaining of sx starting Thursday.   No change in sx or meds.  Was feeling  fatigued.   Drinking fluids now.   Stopped lithium  Saturday.   Stay off lithium  for a couple of days and then call back when sx resolve.  No other changes.   No SI lately.    She agrees. Kim Beat, MD, DFAPA     11/02/22 appt noted: Stopped lithium  on weekend.  Restarted at 300 mg daily. Feels a little shakey, nausea, mild dizzy feeling.  Mild tremor. No med changes or additions last week.  Eating and drinking normally. Over the last month mood is pretty good overall.   No SI in the last month. She has felt physically anxious and can't place why.  Not much OCD right now. More energy and focus in the day with modafinil . No NSAIDs. Lithium  does seem to work and would like to continue it.   Had some tremor before lithium .  Uncertain if family hisotry of it. Plan; no changes  11/12/22 appt noted: Overall N is better and less Zofran . Propranolol  prn anxiety Normal amount of tremor and nausea.  No longer unsteady.   Mentally ok.   Kathline Paras living with them and that has helped mood. Uncertain future.  Goal is to save up enough for apt.  But friend may be there a long while.  Met first year college of [employer]. Not lonely or bored all the time and those are the things that really get me.   No SI since here. Nothing terribly excessive anxiety but some residual OCD is the main source of the anxiety. No SE now.   Asks about increasing lithium . Plan: Modafinil  off label DT poor tolerance stimulants, modafinil  100 mg AM.    03/24/23 appt noted:  goes by Kim [name] Couple NS since here Meds: fluoxetine  60 mg for couple mos, modafinil  200 mg AM, lithobid  600 Modafinil  helped focus and attention.   No SE Increased fluoxetine  to 60. Not bad mood but disconnected. No usual stress.   At home.  Starting new job FT early October.  Excited. Cut leg since here and had to get stitches middle August.  Dont' remember how she was feeling at the time.  No SI.  No sig SI since here.  Losing time and  can't recall wht she did yesterday.  Alcohol 3-4 days per week 2-3 beers and on weekend more.  Occ THC. No other drugs.   No NM.  Weird dreams and thinks their repetitive.  Sleep about 9 hours.   Not much obsessive thinking. No sig trauma history.  Not much other anxiety.   N is better. Plan retry Wellbutrin  with other meds for mood , anhedonia  07/28/23 appt noted: with mother, Kim [name] Psych meds: stopped Wellbutrin  XL 300 every morning, incre fluoxetine  80 daily, Lithobid  600 daily,  off modafinil  200 every morning bc didn't get a RF, propranolol  10 mg 1-2 twice daily as needed anxiety or tremor. "Kim [name]" reports stopped Wellbutrin  DT N, dizziness, sleepiness. Last month incr fluoxetine  to 80 mg on her own for depression.  Winter typically low energy.  Affects function.   Also depressed.  Not much effect with increase fluoxetine . No current SE. Having joint pain all the time.  Esp if in one position too long.   Sleep 9-10 hours interrupted.  Sleep more in winter.   M notes for several months withdrawn and hard to converse.  Will isolate in her room.  Sees her with hopelessness.   Discouraged over not finding a job.  Lack of energy and motivation.  No self harm.   F benefit pramipexole. Plan: continue meds.  Resume modafinil . Vraylar  1.5 mg daily for bipolar depression.  samples  08/24/23 appt noted: Psych med: fluox 60, lithobid  600 HS, modafinil  200 AM, propranolol  10-20 mg BID prn , Vraylar  1.5 mg daily. Higher energy with Vraylar .  More productive.  Better mood. Seasonal dep.  Hard to say if out of depression therefeore. No SE with Vraylar . Less sleep to 7-8 hours.  Some awakening with pain.  Not drowsy.   Satisfied with meds now but ready for Spring. Plan no changes  09/28/23 appt noted: Med: fluox 60, lithobid  600 HS, modafinil  200 AM, propranolol  10-20 mg BID prn , OUT Vraylar  1.5 mg daily for a few weeks Has felt kind of numb lately.  Annoying.  Harder to focus and motivate  without it.   No SE with Vraylar .   Sleep inconsistent either too much or too lately.  Kind of cyclical . No sig anxiety. Applying for jobs locally.   Plan: Ran out of Vraylar  1.5 mg daily for bipolar depression.  Will restart it today bc prior benefit of dep  12/08/23 appt noted; Med: fluox 60, lithobid  600 HS, modafinil  200 AM, propranolol  10-20 mg BID prn helps heart flutter and tremor, Vraylar  1.5 mg daily  Fell and fx foot.  Will wear boot for 3 mos. Good spot emotionally.   More energy and motivation back on Vraylar . No SE except mild tremor.   Off and on caffeine. Sleep pretty good.   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   Modafinil  helps focus and alertness.   No concerns with meds. No sig dep resolves quickly and no SI  No current Therapist Andy Bannister, Journalist, newspaper. Previous Vara Gentle without help.  GF OCD. F bipolar lithium  with benefit.  Past Psychiatric Medication Trials: History of Tourette's, ADD, OCD History of ADD brief trials Adderall XR 15, guanfacine ER 1 tired, methylphenidate , Strattera  Quillevent SI Vyvanse  N Modafinil  100 tolerated. Lithium  900 tolerated; 1200 SE Lexapro  40, sertraline liquid brief hard to swallow,   Fluoxetine  60 better mood Wellbutrin  SE mirtazapine,   Abilify  10 EPS "mouth tics" & no better than 5 which helped. Trileptal  brief increase SI  Psychiatric hospitalization 11/06/2021 until 11/12/2021 at [facility] in [place] for depression with suicidal ideation after drinking alcohol with a bottle of cough syrup and use of marijuana as well as noncompliance with psychiatric medications. Prescribed Abilify  5 mg daily along with fluoxetine  40 mg daily. Had EPS with increased Abilify  to 10 mg daily.   Stopped DT "mouth tics".  Review of Systems:  Review of Systems  Cardiovascular:  Negative for palpitations.  Gastrointestinal:  Negative for nausea.  Musculoskeletal:  Positive for arthralgias and joint swelling.  Neurological:   Negative for tremors.  Psychiatric/Behavioral:  Negative for dysphoric mood and sleep disturbance.     Medications: I have reviewed the patient's current medications.  Current Outpatient Medications  Medication Sig Dispense Refill   cariprazine  (VRAYLAR ) 1.5 MG capsule Take 1 capsule (1.5 mg total) by mouth daily. 90 capsule 1   ondansetron  (ZOFRAN -ODT) 8 MG disintegrating tablet TAKE 1 TABLET BY MOUTH EVERY 8 HOURS AS NEEDED FOR NAUSEA AND VOMITING 20 tablet 0   propranolol  (INDERAL ) 10 MG tablet TAKE 1 TO 2 TABLETS BY MOUTH TWICE A DAY AS NEEDED FOR ANXIETY OR TREMOR 360 tablet 0   FLUoxetine  (PROZAC ) 20 MG capsule Take 3 capsules (60 mg total) by mouth daily. 270 capsule 1   lithium  carbonate (LITHOBID ) 300 MG ER tablet Take 2 tablets (600 mg total) by mouth daily. 180 tablet 1   modafinil  (PROVIGIL ) 200 MG tablet TAKE 1 EVERY DAY IN THE MORNING **PA DENIED** 30 tablet 2   traZODone  (DESYREL ) 50 MG tablet Take 1 tablet (50 mg total) by mouth at bedtime as needed for sleep. (Patient not taking: Reported on 12/08/2023) 30 tablet 0   No current facility-administered medications for this visit.    Medication Side Effects: None  Allergies: No Known Allergies  Past Medical History:  Diagnosis Date   Constipation    GERD (gastroesophageal reflux disease)    Granuloma of skin    on forehead    Family History  Problem Relation Age of Onset   GER disease Father    Ulcerative colitis Father    ADD / ADHD Father    Anxiety disorder Father    Depression Father    Bipolar disorder Father    Migraines Paternal Grandmother    Anxiety disorder Paternal Grandmother    Depression Paternal Grandmother    Seizures Neg Hx    Autism Neg Hx    Schizophrenia Neg Hx     Social History   Socioeconomic History   Marital status: Single    Spouse name: n/a   Number of children: 0   Years of education: Not on file   Highest education level: Not on file  Occupational History   Occupation:  student  Tobacco Use   Smoking status: Never   Smokeless tobacco: Never  Vaping Use   Vaping status: Never Used  Substance and Sexual Activity   Alcohol use: No   Drug use: No   Sexual activity: Never  Other Topics Concern   Not on file  Social History Narrative   Lives with mom, dad, brother in the same house. She is in the 11th grade at [employer]. Does well in school. She enjoys listening to music, writing and singing.    Social Drivers of Corporate investment banker Strain: Not on file  Food Insecurity: Not on file  Transportation Needs: Not on file  Physical Activity: Not on file  Stress: Not on file  Social Connections: Unknown (02/24/2023)   Received [facility]   Social Network    Social Network: Not on file  Intimate Partner Violence: Not At Risk (02/24/2023)   Received from [facility]   HITS    Over the last 12 months how often did your partner physically hurt you?: Never    Over the last 12 months how often did your partner insult you or talk down to you?: Never    Over the last 12 months how often did your partner threaten you with physical harm?:  Never    Over the last 12 months how often did your partner scream or curse at you?: Never    Past Medical History, Surgical history, Social history, and Family history were reviewed and updated as appropriate.   Please see review of systems for further details on the patient's review from today.   Objective:   Physical Exam:  There were no vitals taken for this visit.  Physical Exam Constitutional:      General: She is not in acute distress.    Appearance: She is well-developed.  Musculoskeletal:        General: No deformity.  Neurological:     Mental Status: She is alert and oriented to person, place, and time.     Cranial Nerves: No dysarthria.     Coordination: Coordination normal.  Psychiatric:        Attention and Perception: Attention and perception normal.        Mood and Affect: Mood is  not anxious or depressed. Affect is not labile, blunt, angry or inappropriate.        Speech: Speech normal.        Behavior: Behavior normal. Behavior is cooperative.        Thought Content: Thought content normal. Thought content is not paranoid or delusional. Thought content does not include homicidal or suicidal ideation. Thought content does not include suicidal plan.        Cognition and Memory: Cognition and memory normal.        Judgment: Judgment normal.     Comments: Insight intact Anxiety manageable  Depression less anhedonia on Vraylar   Fidgety mild     Lab Review:  No results found for: "NA", "K", "CL", "CO2", "GLUCOSE", "BUN", "CREATININE", "CALCIUM ", "PROT", "ALBUMIN", "AST", "ALT", "ALKPHOS", "BILITOT", "GFRNONAA", "GFRAA"  No results found for: "WBC", "RBC", "HGB", "HCT", "PLT", "MCV", "MCH", "MCHC", "RDW", "LYMPHSABS", "MONOABS", "EOSABS", "BASOSABS"  No results found for: "POCLITH", "LITHIUM "   No results found for: "PHENYTOIN", "PHENOBARB", "VALPROATE", "CBMZ"   Review [facility] testing dated September 30, 2017.  SLC 6 A4 was long long.  HDR2A was normal genotype. Pharmacokinetic genes included normal 2D6, 3 A4, 2 C9, 2C19, 1A2, and intermediate 2B 6  10/23/22 Lithium  level 1.1. while taking 1200 mg day but had   .res Assessment: Plan:    Jamise "Kim [name]" was seen today for follow-up and depression.  Diagnoses and all orders for this visit:  Bipolar affective disorder, rapid cycling (HCC) -     lithium  carbonate (LITHOBID ) 300 MG ER tablet; Take 2 tablets (600 mg total) by mouth daily.  Mixed obsessional thoughts and acts -     FLUoxetine  (PROZAC ) 20 MG capsule; Take 3 capsules (60 mg total) by mouth daily.  Attention deficit hyperactivity disorder (ADHD), combined type, moderate -     modafinil  (PROVIGIL ) 200 MG tablet; TAKE 1 EVERY DAY IN THE MORNING **PA DENIED**  Tourette's disorder  Lithium  use    30 min face to face time with patient was spent on  counseling and coordination of care.   SX overall under fair control but flatter out of Vraylar .  Discussed her concerns that she may have bipolar disorder type II, recent hypomania.  She has done some reading on the subject.  That appears to be accurate.  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  We discussed how the presence of SSRIs used for her OCD may increase the risk of rapid cycling.  However there is not a good  alternative to SSRIs for OCD. Extensive discussion about alternative types of mood stabilizers including lithium , Depakote and atypical antipsychotics.  Lithium  is not reliably effective for rapid cycling but father is bipolar with benefit from lithium .  We discussed the option of Depakote but not ideal in woman of child bearing age but we may have little other options DT apprent SE antipsychotic.    Due to the suicidal thoughts lithium  was a good choice.  Her father also responded to lithium .  Informed her we could go up in the dose if she starts developing manic symptoms.  We discussed again the importance of the mood stabilizer.  We will use Lithobid  because of nausea problems. Icontinue lower dose  Lithobid  600 mg nightly  Repeat lithium  level.  Not gotten as requested.  Counseled patient regarding potential benefits, risks, and side effects of lithium  to include potential risk of lithium  affecting thyroid  and renal function.  Discussed need for periodic lab monitoring to determine drug level and to assess for potential adverse effects.  Counseled patient regarding signs and symptoms of lithium  toxicity and advised that they notify office immediately or seek urgent medical attention if experiencing these signs and symptoms.  Patient advised to contact office with any questions or concerns. sx lithium  toxicity resolved.  For mood  fluoxetine  to 60 mg daily for depression and she's continued it.      She has learned to swallow capsules while at the hospital.   We discussed the risk of worsening OCD at the lower dose  Coffee worsens tremor discussed and she's aware.  Resumed Modafinil  off label DT poor tolerance stimulants, modafinil  100-200 mg AM.  Off label for ADD bc N with Vyvanse  and poor mood tolerance. Tolerating this well. And it helped.   Vraylar  1.5 mg daily for bipolar depression.  Will restart it today bc prior benefit of dep Discussed potential metabolic side effects associated with atypical antipsychotics, as well as potential risk for movement side effects. Advised pt to contact office if movement side effects occur.  This has the potential of being monotherapy.  Disc other options.   Discussed the importance of avoiding marijuana and alcohol abuse at this time  We discussed her father's psychiatric meds and the potential for use of lamotrigine.  We will we will defer this choice at this time in order to keep medications as simple as possible  vitamin D 4000-5000 units daily in winter.  Disc counseling  Consider lamotrigine for seasonal depression  Call if symptoms worsen or she has significant side effects.  FU 3 mos  Kim Beat, MD, DFAPA  Please see After Visit Summary for patient specific instructions.  No future appointments.         No orders of the defined types were placed in this encounter.    -------------------------------

## 2023-12-11 ENCOUNTER — Other Ambulatory Visit: Payer: Self-pay | Admitting: Psychiatry

## 2023-12-11 DIAGNOSIS — R251 Tremor, unspecified: Secondary | ICD-10-CM

## 2023-12-11 DIAGNOSIS — T56891A Toxic effect of other metals, accidental (unintentional), initial encounter: Secondary | ICD-10-CM

## 2023-12-26 DIAGNOSIS — S92352D Displaced fracture of fifth metatarsal bone, left foot, subsequent encounter for fracture with routine healing: Secondary | ICD-10-CM | POA: Diagnosis not present

## 2024-01-04 DIAGNOSIS — Z1589 Genetic susceptibility to other disease: Secondary | ICD-10-CM | POA: Diagnosis not present

## 2024-01-04 DIAGNOSIS — M549 Dorsalgia, unspecified: Secondary | ICD-10-CM | POA: Diagnosis not present

## 2024-01-04 DIAGNOSIS — M2560 Stiffness of unspecified joint, not elsewhere classified: Secondary | ICD-10-CM | POA: Diagnosis not present

## 2024-01-04 DIAGNOSIS — M4690 Unspecified inflammatory spondylopathy, site unspecified: Secondary | ICD-10-CM | POA: Diagnosis not present

## 2024-01-17 DIAGNOSIS — M79672 Pain in left foot: Secondary | ICD-10-CM | POA: Diagnosis not present

## 2024-01-31 DIAGNOSIS — Z6823 Body mass index (BMI) 23.0-23.9, adult: Secondary | ICD-10-CM | POA: Diagnosis not present

## 2024-01-31 DIAGNOSIS — J029 Acute pharyngitis, unspecified: Secondary | ICD-10-CM | POA: Diagnosis not present

## 2024-02-14 DIAGNOSIS — M4690 Unspecified inflammatory spondylopathy, site unspecified: Secondary | ICD-10-CM | POA: Diagnosis not present

## 2024-02-28 DIAGNOSIS — M4690 Unspecified inflammatory spondylopathy, site unspecified: Secondary | ICD-10-CM | POA: Diagnosis not present

## 2024-03-13 ENCOUNTER — Ambulatory Visit (INDEPENDENT_AMBULATORY_CARE_PROVIDER_SITE_OTHER): Payer: Self-pay | Admitting: Psychiatry

## 2024-03-13 DIAGNOSIS — M4690 Unspecified inflammatory spondylopathy, site unspecified: Secondary | ICD-10-CM | POA: Diagnosis not present

## 2024-03-13 DIAGNOSIS — Z91199 Patient's noncompliance with other medical treatment and regimen due to unspecified reason: Secondary | ICD-10-CM

## 2024-03-13 NOTE — Progress Notes (Signed)
 No show

## 2024-04-05 DIAGNOSIS — M4690 Unspecified inflammatory spondylopathy, site unspecified: Secondary | ICD-10-CM | POA: Diagnosis not present

## 2024-04-05 DIAGNOSIS — M549 Dorsalgia, unspecified: Secondary | ICD-10-CM | POA: Diagnosis not present

## 2024-04-05 DIAGNOSIS — Z1589 Genetic susceptibility to other disease: Secondary | ICD-10-CM | POA: Diagnosis not present

## 2024-04-05 DIAGNOSIS — M2560 Stiffness of unspecified joint, not elsewhere classified: Secondary | ICD-10-CM | POA: Diagnosis not present

## 2024-04-12 DIAGNOSIS — R519 Headache, unspecified: Secondary | ICD-10-CM | POA: Diagnosis not present

## 2024-04-14 ENCOUNTER — Other Ambulatory Visit: Payer: Self-pay | Admitting: Psychiatry

## 2024-04-14 DIAGNOSIS — F902 Attention-deficit hyperactivity disorder, combined type: Secondary | ICD-10-CM

## 2024-04-14 DIAGNOSIS — R251 Tremor, unspecified: Secondary | ICD-10-CM

## 2024-04-14 DIAGNOSIS — T56891A Toxic effect of other metals, accidental (unintentional), initial encounter: Secondary | ICD-10-CM

## 2024-04-15 NOTE — Telephone Encounter (Signed)
 Please call to schedule FU, was a no show last appt.

## 2024-04-16 NOTE — Telephone Encounter (Signed)
Pt has appt 10/20

## 2024-04-16 NOTE — Telephone Encounter (Signed)
 Lvm for pt to call and schedule

## 2024-04-17 DIAGNOSIS — M4690 Unspecified inflammatory spondylopathy, site unspecified: Secondary | ICD-10-CM | POA: Diagnosis not present

## 2024-04-24 DIAGNOSIS — F32A Depression, unspecified: Secondary | ICD-10-CM | POA: Diagnosis not present

## 2024-04-30 ENCOUNTER — Ambulatory Visit (INDEPENDENT_AMBULATORY_CARE_PROVIDER_SITE_OTHER): Admitting: Psychiatry

## 2024-04-30 ENCOUNTER — Encounter: Payer: Self-pay | Admitting: Psychiatry

## 2024-04-30 DIAGNOSIS — F902 Attention-deficit hyperactivity disorder, combined type: Secondary | ICD-10-CM | POA: Diagnosis not present

## 2024-04-30 DIAGNOSIS — F319 Bipolar disorder, unspecified: Secondary | ICD-10-CM

## 2024-04-30 MED ORDER — LISDEXAMFETAMINE DIMESYLATE 20 MG PO CAPS: 20.0000 mg | ORAL_CAPSULE | Freq: Every day | ORAL | 0 refills | Status: DC

## 2024-04-30 MED ORDER — CARIPRAZINE HCL 3 MG PO CAPS: 3.0000 mg | ORAL_CAPSULE | Freq: Every day | ORAL | 0 refills | Status: DC

## 2024-04-30 NOTE — Patient Instructions (Signed)
 Please get lithium  level ASAP

## 2024-04-30 NOTE — Progress Notes (Signed)
 AALANI AIKENS [id number] Dec 12, 2000 23 y.o.   Subjective:   Patient ID:  Kim [name] is a 23 y.o. (DOB 01-31-2001) female.  Chief Complaint:  Chief Complaint  Patient presents with   Follow-up   Depression   Anxiety   ADD    HPI Kim [name] presents today for follow-up of OCD, ADD, depression  02/20/20 appt with the following noted: Seen with mother Delon and father Myer.  Rob needing psych help but gave up his appt for daughter to be seen before going to school. She's going to [place]  to school. She has OCD, ADD, Tourette's and depression.  They wonder about other med changes. She needs liquids bc anxiety swallowing pills. Pt doesn't drive DT anxiety. Would like help with OCD.  Hesitant about ADD meds but poor tolerance with anxiety. Contamination fears with handwashing.  Germs in general and fear of vomtiing so hard to take pills. Handwashes time hard to say but a couple of minutes.  Irritating time.   Triggers include cleaning.   She will avoid hospitals and in general doctors offices. Mo concerned OCD takes a lot of time and attention. She recognizes OCD started in middle school. Parents in support of her going to school Depressed but not severe.  Not a direct consequence of OCD.  Feels exhausted and not motivated. Sleep about 9 hours.  Energy OK.  Not much to concentrate on.  Hard to read. ADD lack of focus is a problem on reading and is a concern for school.  Had racing heart before stimulants and they made it worse. Can get this with anxiety and get dizzy. Leaves for school 8/31. [employer] first year.  In [place].    03/07/20 appt with the following noted: Abilify  5 definitely helped with depression. Dep 3/10.   More active.  Better productivity.  Better mood. Some difference with OCD.  Not as big improvement as with depression but better.  Not sure about time spent with OCD.  Anxiety generally 5/10/  Worst times 8/10.   Biggest concern about OCD and  school is the potential time involved.  Went time to do school work sometimes hard to resist OCD.  Not very concerned about OCD affecting her socially.   SE: Some hunger at times.  Restlessness fine now.   Plan: Continue Lexapro  at current dose Increase Abilify  to 7-8 mg now and if improved then no higher. If no improvement then increase to 10 mg daily.   06/25/2020 appointment with the following noted: Mostly good but not much improvement with OCD.  Depression changing like usual.  Weather affects her and other factors.  History of seasonal depression.  More tired and sleep more.  Annoying but not a huge deal.   Increase in Abilify  without sig change.   Finishes exams Thursday and break for a month. No SE.   Able to enjoy things. Handwashing 5 min each time she washes them.  Hard to know how much total time. Tremor before Lexapro .    Plan: Continue Lexapro  but increase to 35 mg daily for 2 weeks, then 40 mg daily. High dose medically necessary.  Needs liquid and couldn't tolerate the sertraline. After exams drop Abilify  back to 5 mg bc not better with increase to 10 mg.  08/12/2020 telephone call with the following noted: Kim [name] was taken off of Abilify  and had her Lexapro  increased. Since doing this she is feeling worse, more depressed and lack of energy. She would like to  know what she needs to do to help with this MD response: Her last appointment was 06/25/2020.  Prior to that appointment we had increased Abilify  from 5 mg to 10 mg without additional improvement.  Therefore the instructions were to reduce the Abilify  back to 5 mg daily.  She was not instructed to discontinue it. Because the increase in Abilify  had not been helpful from 5 to 10 mg, in addition to reducing the Abilify  back to 5 mg she was to increase the Lexapro  from 30 mg to 40 mg. It is unlikely that increasing Lexapro  would make her more depressed.  I think the depression is worse because she stopped the Abilify  apparently.   The increase in Lexapro  could possibly make her more fatigued but worsening depression could be causing the fatigue.  The benefit from restarting Abilify  5 mg daily should be quick, that is benefit within a week. Therefore I suggest she continue the current dose of Lexapro  and add 5 mg of Abilify  back.  The depression should improve within a week or so.  If she is not better by February 10 then call us  back  11/10/2020 appt noted: Stayed 1 semester at [employer] not a good fit and left.  Goiing to [employer] in the fall. Felt worse off the Abilify  and restarted at 5 mg 08/12/20 and felt better energy. Doing pretty good, pretty busy.  Couple of classes at [employer] just to get some credit.s OCD is about the same without much change with increase in Lexapro .  No SE noted. Anxiety is fairly high usually associated with obsessive thoughts. Not too much depression. Sleep regular. NO SE.   Not regular Zofran  but occ and not sure what causes the nausea. Plan: Reduce Lexapro  to 20 ml and add 5 ml fluoxetine  for 1 week, Then reduce Lexapro  to 10 ml and increase fluoxetine  to 10 ml for 1 week, Then stop Lexapro  and increase fluoxetine  to 15 ml daily  02/16/2021 appointment with the following noted:  Seen with DAD Switched to fluoxetine  15 ml and energy and motivation and mood is better.  Anxiety is a little better but not b much.  OCD is about the same. No problems with the switch in meds.   Hard to tell how much time OCD takes bc she can mutifunciton while does compulsions. Avoids contaminants and handwashes but can work at [employer]. No SE.   Not really depressed.   Takes Zofran  intermittently a couple times per week can be triggered with caffeine. Father says she's seemed to be doing well and excited about school. Going to [employer] to [employer]. Less tremor. Plan: Option increase fluoxetine  to 80 mg daily for OCD.  She'd like to try it.  07/01/2021 appointment  with the following noted: Didn't increase bc fluoxetine  dose is the same. 60 mg daily. Been doing fairly well except for when off for several weeks.  When on it I feel good. No SE Don't like the quillivant  XR bc triggers intrusive SI and when coming off has depression and SI.  Not had this with other stimulants.  Takes it rarely. Still on aripiprazole  5 ml daily. No other SE. Mildly depressed.   Can have a good portion of time with OCD but doesn't always interfere with other things. Still nausea and not sure why.  Not caused by meds. When off Prozac  grades went down. Goes back to school Jan 11.  01/04/2022 appointment with the following noted: Psychiatric  hospitalization 11/06/2021 until 11/12/2021 at [facility] in [place] for depression with suicidal ideation after drinking alcohol with a bottle of cough syrup and use of marijuana as well as noncompliance with psychiatric medications. Prescribed Abilify  5 mg daily along with fluoxetine  40 mg daily She's at home. She doesn't remember much about how she was doing then.  She had stopped meds a month or 2 before hosp but not sure why.  Before hosp was a few drinks 4 nights a week.  With drew from 1 class and passed the other 2.  Was smoking marijuana not daily but soemtimes multiple times per day.  Sometimes missed classes from oversleeping.  No opiates.   I think I might be bipolar bc was careening back and forth emotionally. Didn't sleep last night. Done some research on bipolar 2 including hyperactivity, with bursts of creativity and not sleeping. Also hypertalkative and hypersexual and risk taking and grandiose thinking.  Upswings might last days.  Mixed states also. Distinct depressions might last weeks.  Not much normal mood. Been at beach with friend last week.  No comments from family or friend. F thinks she might be bipolar. Has compulsions but not anxious about it. Handwashing a lot less than in the past. No panic and no avoidance. Had  anxiety about classes bc wasn't prepared enough. Swallowing is better now. Nausea for years without pattern. Consistent with meds. Consistent with Abilify  5 and fluoxetine  80 Plan: She elects to increase Abilify  to 10 mg daily.  01/22/22 appt noted: Increase Abilify  10 mg daily with SE mouth tics and reduced to 5 mg daily.  No one commented.  Moving around jaw would cause some pain soreness..  better on lower dose re: SE. Reduced just recently. Mood has been high and probably hypomanic or manic. Sometimes hears people knock on window or hears her name.  Weird. Staying up late and some EMA.  Avg 4-6 hours per night.  Some nights no sleep.   Mood and confidence and energy increase.  Always a bad spender, some increased risk taking behavior.  More talkative. Anxiety has been alright and not as bad as it was.   Nausea but no other sx physically. Plan: Trileptal  and increase to 150 mg every morning and 300 mg nightly.   Reduced fluxoeine to 60 DT rapid cycling Reduced Abilify  to 5 mg daily. Continue Vyvanse  30  02/12/2022 appointment with the following noted: Not taking Trilepotal, stopped Trileptal  DT increase SI so stopped.  Took it a few days. Still having some SI.  Not all the time but not too strong bc no plan.  If sees knife will have the thought.   Off Abilify . Has been depressed since after vacation.  Low energy and mood, little motivation.  Neg self talk. No irritability or anger. Sleep too much 10+ hours.  No napping. Not doing much.   At the beach was feeling fine.   Main stress, boredom and loneliness. Planning to return to school and feels capable.  Benefit structure and friends. Not much anxiety or OCD. Mouth tics resolved. Persistent N and lost 10# bc hard to eat. Plan: Start Lithobid  300 mg nightly x 4 days then 600 mg nightly.  03/19/22 appt noted: Sharing apt. Good with room mates. Start Lithobid  300 mg nightly x 4 days then 600 mg nightly.  No effect noted. No SI Up  and down with downs more noticeable.  Poor concept of time.  But days duration. With lows doesn't want to do  anything. Only missed 2 classes.  Lays around when depressed. Sometimes can tell some mania and high energy and want to do so much stuff.SABRA  around the end of last week lasting 4-5 days. Not suire about mood now, can't identify how I'm feeling. Seeing out people and don't want to be alone.  Wouldn't say I'm not depressed. Not much anxiety. OCD not bad at all Rare trazodone . Worked with some hangover. nNausea is about the same. No cause known and has appt with doctor. Mouth tics much better off Abilify  Sleep irregular from 4-12 hours.  Sometimes don't feel like sleeping.    04/21/22 appt noted: Taking lithium  600 mg daily. Missed Vyvanse  and N is better.  Did help with ADD. Sleep is better but more than she would like. Willing to try increasing the lithium . She wants help with ADD but has not tolerated stimulants very well.  She is keeping up with class work for the most part and is not missing classes generally.  She needs to sleep more than she would like about 10 hours.  No major mood swings.  No suicidal thoughts since she was last here.  Anxiety is manageable but she does feel somewhat stressed by her schoolwork. Plan: Still rec increase  Lithobid  900 mg nightly. Check lithium  level.  09/22/22 appt noted: Made changes noted above.  So much better with change to daylight savings time.  But has had a lot of depression and some SI.  Really bad Nov-Dec .  Didn't go back to college.  Not good Jan & Feb.  March better with spring but not as good as she would like.  SI as recent as last week.  Can be triggered if feeling unwanted in social situations.  But can be triggered without reason.   Dep better on spring break with friends lately.  Overall dep 6-7/10 and anxiety 4/10 lately. No sig manic sx since here.   No sig SE.    Plan: Due to rapid cycling we have reduced the fluoxetine  to 40 mg  daily. Took 60 for 7 weeks.   Increase fluoxetine  to 60 mg daily for depression. Continue lihtium 900, modafinil  100-200 mg AM prn ADD sx., trazodone  50 mg HS prn  09/29/22 TC:  Patient was last seen on 3/13 and her fluoxetine  dose was increased. She is reporting frequent mood changes, said she was probably hypomanic, now describing increased depression, having anger outbursts which she said is not like her. She reports intermittent awakening at night, but is able to go back to sleep.    She also had an issue with not being able to get her modafinil .     MD resp: We need to stop the mood swings as top priority bc the depression will not be controlled as long as having mood swings.  Lithium  should be preventing the mood swings but is not and that may be due to blood level being too low.  I have requested her to get a blood level sometime ago but I have never received 1.  In order to speed the process up have her increase the lithium  to 4 tablets daily and get a blood level in a week.  The day before the blood test take the lithium  as normal and get the blood test approximately 12 hours after the last dose of lithium .  It is very important that she get that level so we can determine whether lithium  is going to work for her or not.  But  she needs to wait 5 to 7 days after increasing the lithium  to 4 tablets daily before getting the blood level. Ill send lihtium level order to [facility].     10/25/22 TC:  Pt called 1:04pm today. [facility] and they took bloodwork on her but think she had lithium  toxicity. She went to [employer] in Clinic. She was taking 1200mg  of lithium  right now. She is not sure what to do and lab results haven't come back yet. (Shakiness, Unsteady, Dizzy, cloudy, Vision blurred, stomach upset , fatigued, body aches)    MD resp:  RTC  Lithium  level 1.1.  stopped a couple of days ago.  Complaining of sx starting Thursday.   No change in sx or meds.  Was  feeling fatigued.   Drinking fluids now.   Stopped lithium  Saturday.   Stay off lithium  for a couple of days and then call back when sx resolve.  No other changes.   No SI lately.    She agrees. Lorene Macintosh, MD, DFAPA     11/02/22 appt noted: Stopped lithium  on weekend.  Restarted at 300 mg daily. Feels a little shakey, nausea, mild dizzy feeling.  Mild tremor. No med changes or additions last week.  Eating and drinking normally. Over the last month mood is pretty good overall.   No SI in the last month. She has felt physically anxious and can't place why.  Not much OCD right now. More energy and focus in the day with modafinil . No NSAIDs. Lithium  does seem to work and would like to continue it.   Had some tremor before lithium .  Uncertain if family hisotry of it. Plan; no changes  11/12/22 appt noted: Overall N is better and less Zofran . Propranolol  prn anxiety Normal amount of tremor and nausea.  No longer unsteady.   Mentally ok.   Alyse Domino living with them and that has helped mood. Uncertain future.  Goal is to save up enough for apt.  But friend may be there a long while.  Met first year college of [employer]. Not lonely or bored all the time and those are the things that really get me.   No SI since here. Nothing terribly excessive anxiety but some residual OCD is the main source of the anxiety. No SE now.   Asks about increasing lithium . Plan: Modafinil  off label DT poor tolerance stimulants, modafinil  100 mg AM.    03/24/23 appt noted:  goes by [facility] Couple NS since here Meds: fluoxetine  60 mg for couple mos, modafinil  200 mg AM, lithobid  600 Modafinil  helped focus and attention.   No SE Increased fluoxetine  to 60. Not bad mood but disconnected. No usual stress.   At home.  Starting new job FT early October.  Excited. Cut leg since here and had to get stitches middle August.  Dont' remember how she was feeling at the time.  No SI.  No sig SI since here.  Losing  time and can't recall wht she did yesterday.  Alcohol 3-4 days per week 2-3 beers and on weekend more.  Occ THC. No other drugs.   No NM.  Weird dreams and thinks their repetitive.  Sleep about 9 hours.   Not much obsessive thinking. No sig trauma history.  Not much other anxiety.   N is better. Plan retry Wellbutrin  with other meds for mood , anhedonia  07/28/23 appt noted: with mother, Delon Psych meds: stopped Wellbutrin  XL 300 every morning, incre  fluoxetine  80 daily, Lithobid  600 daily, off modafinil  200 every morning bc didn't get a RF, propranolol  10 mg 1-2 twice daily as needed anxiety or tremor. Jude reports stopped Wellbutrin  DT N, dizziness, sleepiness. Last month incr fluoxetine  to 80 mg on her own for depression.  Winter typically low energy.  Affects function.   Also depressed.  Not much effect with increase fluoxetine . No current SE. Having joint pain all the time.  Esp if in one position too long.   Sleep 9-10 hours interrupted.  Sleep more in winter.   M notes for several months withdrawn and hard to converse.  Will isolate in her room.  Sees her with hopelessness.   Discouraged over not finding a job.  Lack of energy and motivation.  No self harm.   F benefit pramipexole. Plan: continue meds.  Resume modafinil . Vraylar  1.5 mg daily for bipolar depression.  samples  08/24/23 appt noted: Psych med: fluox 60, lithobid  600 HS, modafinil  200 AM, propranolol  10-20 mg BID prn , Vraylar  1.5 mg daily. Higher energy with Vraylar .  More productive.  Better mood. Seasonal dep.  Hard to say if out of depression therefeore. No SE with Vraylar . Less sleep to 7-8 hours.  Some awakening with pain.  Not drowsy.   Satisfied with meds now but ready for Spring. Plan no changes  09/28/23 appt noted: Med: fluox 60, lithobid  600 HS, modafinil  200 AM, propranolol  10-20 mg BID prn , OUT Vraylar  1.5 mg daily for a few weeks Has felt kind of numb lately.  Annoying.  Harder to focus and  motivate without it.   No SE with Vraylar .   Sleep inconsistent either too much or too lately.  Kind of cyclical . No sig anxiety. Applying for jobs locally.   Plan: Ran out of Vraylar  1.5 mg daily for bipolar depression.  Will restart it today bc prior benefit of dep  12/08/23 appt noted; Med: fluox 60, lithobid  600 HS, modafinil  200 AM, propranolol  10-20 mg BID prn helps heart flutter and tremor, Vraylar  1.5 mg daily  Fell and fx foot.  Will wear boot for 3 mos. Good spot emotionally.   More energy and motivation back on Vraylar . No SE except mild tremor.   Off and on caffeine. Sleep pretty good.   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   Modafinil  helps focus and alertness.   No concerns with meds. No sig dep resolves quickly and no SI  03/13/24 NS  04/30/24 appt noted:  Jude alone Med: fluox 60, lithobid  600 HS, modafinil  200 AM, propranolol  10-20 mg BID prn helps heart flutter and tremor, Vraylar  1.5 mg daily  Unmotivated with random bursts.  Wonders about going back on Vyvanse .  It helped.  Dx spondyloarthritis and the med is causing some wt gain. Feels stuck in life and hard to do anything new.  Not working anywhere bc coffee shop job bc broke foot.  Healed.   Wants ajob somewhere. Mood somewhat up and down with wide range.  Not sure how she will wake up.  Irritability on both ends of spectrum.  On up day has to be doing something.  On down days just watching TV. Usually a few days with ups and down swings.   No sig sleep changes.   No SE noted.   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 Previous Melville Gu without help.  GF OCD. F bipolar lithium  with benefit.  Past Psychiatric Medication Trials: History of Tourette's, ADD,  OCD History of ADD brief trials Adderall XR 15, guanfacine ER 1 tired, methylphenidate , Strattera  Quillevent SI Vyvanse  N Modafinil  100 tolerated. Lithium  900 tolerated; 1200 SE Lexapro  40, sertraline liquid brief hard to swallow,    Fluoxetine  60 better mood Wellbutrin  SE mirtazapine,   Abilify  10 EPS mouth tics & no better than 5 which helped. Vraylar  1.5 Trileptal  brief increase SI  Psychiatric hospitalization 11/06/2021 until 11/12/2021 at [facility] in [place] for depression with suicidal ideation after drinking alcohol with a bottle of cough syrup and use of marijuana as well as noncompliance with psychiatric medications. Prescribed Abilify  5 mg daily along with fluoxetine  40 mg daily. Had EPS with increased Abilify  to 10 mg daily.   Stopped DT mouth tics.  Review of Systems:  Review of Systems  Cardiovascular:  Negative for palpitations.  Gastrointestinal:  Negative for nausea.  Musculoskeletal:  Positive for arthralgias and joint swelling.  Neurological:  Negative for tremors.  Psychiatric/Behavioral:  Negative for dysphoric mood and sleep disturbance.     Medications: I have reviewed the patient's current medications.  Current Outpatient Medications  Medication Sig Dispense Refill   Certolizumab Pegol (CIMZIA [place]) Inject into the skin.     FLUoxetine  (PROZAC ) 20 MG capsule Take 3 capsules (60 mg total) by mouth daily. 270 capsule 1   lisdexamfetamine (VYVANSE ) 20 MG capsule Take 1 capsule (20 mg total) by mouth daily. 30 capsule 0   lithium  carbonate (LITHOBID ) 300 MG ER tablet Take 2 tablets (600 mg total) by mouth daily. 180 tablet 1   ondansetron  (ZOFRAN -ODT) 8 MG disintegrating tablet TAKE 1 TABLET BY MOUTH EVERY 8 HOURS AS NEEDED FOR NAUSEA AND VOMITING 20 tablet 0   propranolol  (INDERAL ) 10 MG tablet TAKE 1 TO 2 TABLETS BY MOUTH TWICE A DAY AS NEEDED FOR ANXIETY OR TREMOR 120 tablet 0   cariprazine  (VRAYLAR ) 3 MG capsule Take 1 capsule (3 mg total) by mouth daily. 90 capsule 0   traZODone  (DESYREL ) 50 MG tablet Take 1 tablet (50 mg total) by mouth at bedtime as needed for sleep. (Patient not taking: Reported on 04/30/2024) 30 tablet 0   No current facility-administered medications for this visit.     Medication Side Effects: None  Allergies: No Known Allergies  Past Medical History:  Diagnosis Date   Constipation    GERD (gastroesophageal reflux disease)    Granuloma of skin    on forehead    Family History  Problem Relation Age of Onset   GER disease Father    Ulcerative colitis Father    ADD / ADHD Father    Anxiety disorder Father    Depression Father    Bipolar disorder Father    Migraines Paternal Grandmother    Anxiety disorder Paternal Grandmother    Depression Paternal Grandmother    Seizures Neg Hx    Autism Neg Hx    Schizophrenia Neg Hx     Social History   Socioeconomic History   Marital status: Single    Spouse name: n/a   Number of children: 0   Years of education: Not on file   Highest education level: Not on file  Occupational History   Occupation: student  Tobacco Use   Smoking status: Never   Smokeless tobacco: Never  Vaping Use   Vaping status: Never Used  Substance and Sexual Activity   Alcohol use: No   Drug use: No   Sexual activity: Never  Other Topics Concern   Not on file  Social  History Narrative   Lives with mom, dad, brother in the same house. She is in the 11th grade at [employer]. Does well in school. She enjoys listening to music, writing and singing.    Social Drivers of Corporate investment banker Strain: Not on file  Food Insecurity: Not on file  Transportation Needs: Not on file  Physical Activity: Not on file  Stress: Not on file  Social Connections: Unknown (02/24/2023)   Received from [facility]   Social Network    Social Network: Not on file  Intimate Partner Violence: Not At Risk (02/24/2023)   Received from [facility]   HITS    Over the last 12 months how often did your partner physically hurt you?: Never    Over the last 12 months how often did your partner insult you or talk down to you?: Never    Over the last 12 months how often did your partner threaten you with physical harm?: Never     Over the last 12 months how often did your partner scream or curse at you?: Never    Past Medical History, Surgical history, Social history, and Family history were reviewed and updated as appropriate.   Please see review of systems for further details on the patient's review from today.   Objective:   Physical Exam:  There were no vitals taken for this visit.  Physical Exam Constitutional:      General: She is not in acute distress.    Appearance: She is well-developed.  Musculoskeletal:        General: No deformity.  Neurological:     Mental Status: She is alert and oriented to person, place, and time.     Cranial Nerves: No dysarthria.     Coordination: Coordination normal.  Psychiatric:        Attention and Perception: Attention and perception normal.        Mood and Affect: Mood is not anxious or depressed. Affect is not labile, blunt, angry or inappropriate.        Speech: Speech normal.        Behavior: Behavior normal. Behavior is cooperative.        Thought Content: Thought content normal. Thought content is not paranoid or delusional. Thought content does not include homicidal or suicidal ideation. Thought content does not include suicidal plan.        Cognition and Memory: Cognition and memory normal.        Judgment: Judgment normal.     Comments: Insight intact Anxiety manageable  Depression less anhedonia on Vraylar  but cycling. Fidgety mild     Lab Review:  No results found for: NA, K, CL, CO2, GLUCOSE, BUN, CREATININE, CALCIUM , PROT, ALBUMIN, AST, ALT, ALKPHOS, BILITOT, GFRNONAA, GFRAA  No results found for: WBC, RBC, HGB, HCT, PLT, MCV, MCH, MCHC, RDW, LYMPHSABS, MONOABS, EOSABS, BASOSABS  No results found for: POCLITH, LITHIUM    No results found for: PHENYTOIN, PHENOBARB, VALPROATE, CBMZ   Review [facility] testing dated September 30, 2017.  SLC 6 A4 was long long.  HDR2A was normal  genotype. Pharmacokinetic genes included normal 2D6, 3 A4, 2 C9, 2C19, 1A2, and intermediate 2B 6  10/23/22 Lithium  level 1.1. while taking 1200 mg day but had   .res Assessment: Plan:    Tasia Jude was seen today for follow-up, depression, anxiety and add.  Diagnoses and all orders for this visit:  Attention deficit hyperactivity disorder (ADHD), combined type, moderate -  lisdexamfetamine (VYVANSE ) 20 MG capsule; Take 1 capsule (20 mg total) by mouth daily.  Bipolar affective disorder, rapid cycling (HCC) -     cariprazine  (VRAYLAR ) 3 MG capsule; Take 1 capsule (3 mg total) by mouth daily. -     Lithium  level     30 min face to face time with patient . SX overall under fair control but flatter out of Vraylar .  Discussed  bipolar disorder type II, recent hypomania/ dep cycling.  She has done some reading on the subject.  That appears to be accurate.  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  We discussed how the presence of SSRIs used for her OCD may increase the risk of rapid cycling.  However there is not a good alternative to SSRIs for OCD. Extensive discussion about alternative types of mood stabilizers including lithium , Depakote and atypical antipsychotics.  Lithium  is not reliably effective for rapid cycling but father is bipolar with benefit from lithium .  We discussed the option of Depakote but not ideal in woman of child bearing age but we may have little other options DT repeated SE antipsychotic unless Vraylar  tolerated.  Due to the suicidal thoughts lithium  was a good choice and SI resoflved. But didn't stop all the cylcing. SABRA  Her father also responded to lithium .  Informed her we could go up in the dose if she starts developing manic symptoms.  We discussed again the importance of the mood stabilizer.  We will use Lithobid  because of nausea problems. Icontinue lower dose  Lithobid  600 mg nightly  Need  lithium  level.  Not gotten as  requested.  Counseled patient regarding potential benefits, risks, and side effects of lithium  to include potential risk of lithium  affecting thyroid  and renal function.  Discussed need for periodic lab monitoring to determine drug level and to assess for potential adverse effects.  Counseled patient regarding signs and symptoms of lithium  toxicity and advised that they notify office immediately or seek urgent medical attention if experiencing these signs and symptoms.  Patient advised to contact office with any questions or concerns. sx lithium  toxicity resolved.  For mood  fluoxetine  to 60 mg daily for depression and she's continued it.      She has learned to swallow capsules while at the hospital.  We discussed the risk of worsening OCD at the lower dose  Coffee worsens tremor discussed and she's aware.  She wants to retry Vyvanse  20.    Increase Vraylar  3 mg daily for bipolar depression bc mood cycling. Discussed potential metabolic side effects associated with atypical antipsychotics, as well as potential risk for movement side effects. Advised pt to contact office if movement side effects occur.  This has the potential of being monotherapy.  Disc other options.   Discussed the importance of avoiding marijuana and alcohol abuse at this time  We discussed her father's psychiatric meds and the potential for use of lamotrigine.  We will we will defer this choice at this time in order to keep medications as simple as possible  vitamin D 4000-5000 units daily in winter.  Disc counseling  Consider lamotrigine for seasonal depression  Call if symptoms worsen or she has significant side effects.  FU 2 mos  Lorene Macintosh, MD, DFAPA  Please see After Visit Summary for patient specific instructions.  No future appointments.          Orders Placed This Encounter  Procedures   Lithium  level     -------------------------------

## 2024-05-01 DIAGNOSIS — F32A Depression, unspecified: Secondary | ICD-10-CM | POA: Diagnosis not present

## 2024-05-10 ENCOUNTER — Other Ambulatory Visit: Payer: Self-pay | Admitting: Psychiatry

## 2024-05-10 DIAGNOSIS — R251 Tremor, unspecified: Secondary | ICD-10-CM

## 2024-05-10 DIAGNOSIS — T56891A Toxic effect of other metals, accidental (unintentional), initial encounter: Secondary | ICD-10-CM

## 2024-06-06 ENCOUNTER — Other Ambulatory Visit: Payer: Self-pay

## 2024-06-06 ENCOUNTER — Telehealth: Payer: Self-pay | Admitting: Psychiatry

## 2024-06-06 DIAGNOSIS — F902 Attention-deficit hyperactivity disorder, combined type: Secondary | ICD-10-CM

## 2024-06-06 MED ORDER — LISDEXAMFETAMINE DIMESYLATE 20 MG PO CAPS: 20.0000 mg | ORAL_CAPSULE | Freq: Every day | ORAL | 0 refills | Status: DC

## 2024-06-06 NOTE — Telephone Encounter (Signed)
 Kim [name] lvm that she needs a refill on her vyvanse  20 mg. Pharmacy is [facility] on [address]. Next appt 12/3

## 2024-06-06 NOTE — Telephone Encounter (Signed)
 Pended  Last refill 10/20

## 2024-07-03 ENCOUNTER — Encounter: Payer: Self-pay | Admitting: Psychiatry

## 2024-07-03 ENCOUNTER — Ambulatory Visit (INDEPENDENT_AMBULATORY_CARE_PROVIDER_SITE_OTHER): Admitting: Psychiatry

## 2024-07-03 DIAGNOSIS — F902 Attention-deficit hyperactivity disorder, combined type: Secondary | ICD-10-CM

## 2024-07-03 DIAGNOSIS — F422 Mixed obsessional thoughts and acts: Secondary | ICD-10-CM

## 2024-07-03 DIAGNOSIS — R251 Tremor, unspecified: Secondary | ICD-10-CM

## 2024-07-03 DIAGNOSIS — F319 Bipolar disorder, unspecified: Secondary | ICD-10-CM | POA: Diagnosis not present

## 2024-07-03 DIAGNOSIS — Z79899 Other long term (current) drug therapy: Secondary | ICD-10-CM

## 2024-07-03 DIAGNOSIS — F952 Tourette's disorder: Secondary | ICD-10-CM

## 2024-07-03 MED ORDER — FLUOXETINE HCL 20 MG PO CAPS: 60.0000 mg | ORAL_CAPSULE | Freq: Every day | ORAL | 1 refills | Status: AC

## 2024-07-03 MED ORDER — LISDEXAMFETAMINE DIMESYLATE 20 MG PO CAPS: 20.0000 mg | ORAL_CAPSULE | Freq: Every day | ORAL | 0 refills | Status: AC

## 2024-07-03 MED ORDER — CARIPRAZINE HCL 3 MG PO CAPS: 3.0000 mg | ORAL_CAPSULE | Freq: Every day | ORAL | 0 refills | Status: AC

## 2024-07-03 NOTE — Progress Notes (Signed)
 Kim [name] [id number] 10/07/2000 23 y.o.   Subjective:   Patient ID:  Kim [name] is a 23 y.o. (DOB 2000-08-11) female.  Chief Complaint:  Chief Complaint  Patient presents with   Follow-up   Depression   Anxiety    HPI Kim [name] presents today for follow-up of OCD, ADD, depression  02/20/20 appt with the following noted: Seen with mother Kim [name] and father Kim [name].  Rob needing psych help but gave up his appt for daughter to be seen before going to school. She's going to [place]  to school. She has OCD, ADD, Tourette's and depression.  They wonder about other med changes. She needs liquids bc anxiety swallowing pills. Pt doesn't drive DT anxiety. Would like help with OCD.  Hesitant about ADD meds but poor tolerance with anxiety. Contamination fears with handwashing.  Germs in general and fear of vomtiing so hard to take pills. Handwashes time hard to say but a couple of minutes.  Irritating time.   Triggers include cleaning.   She will avoid hospitals and in general doctors offices. Mo concerned OCD takes a lot of time and attention. She recognizes OCD started in middle school. Parents in support of her going to school Depressed but not severe.  Not a direct consequence of OCD.  Feels exhausted and not motivated. Sleep about 9 hours.  Energy OK.  Not much to concentrate on.  Hard to read. ADD lack of focus is a problem on reading and is a concern for school.  Had racing heart before stimulants and they made it worse. Can get this with anxiety and get dizzy. Leaves for school 8/31. Kim [name] first year.  In [place].    03/07/20 appt with the following noted: Abilify  5 definitely helped with depression. Dep 3/10.   More active.  Better productivity.  Better mood. Some difference with OCD.  Not as big improvement as with depression but better.  Not sure about time spent with OCD.  Anxiety generally 5/10/  Worst times 8/10.   Biggest concern about OCD and school  is the potential time involved.  Went time to do school work sometimes hard to resist OCD.  Not very concerned about OCD affecting her socially.   SE: Some hunger at times.  Restlessness fine now.   Plan: Continue Lexapro  at current dose Increase Abilify  to 7-8 mg now and if improved then no higher. If no improvement then increase to 10 mg daily.   06/25/2020 appointment with the following noted: Mostly good but not much improvement with OCD.  Depression changing like usual.  Weather affects her and other factors.  History of seasonal depression.  More tired and sleep more.  Annoying but not a huge deal.   Increase in Abilify  without sig change.   Finishes exams Thursday and break for a month. No SE.   Able to enjoy things. Handwashing 5 min each time she washes them.  Hard to know how much total time. Tremor before Lexapro .    Plan: Continue Lexapro  but increase to 35 mg daily for 2 weeks, then 40 mg daily. High dose medically necessary.  Needs liquid and couldn't tolerate the sertraline. After exams drop Abilify  back to 5 mg bc not better with increase to 10 mg.  08/12/2020 telephone call with the following noted: Kim [name] was taken off of Abilify  and had her Lexapro  increased. Since doing this she is feeling worse, more depressed and lack of energy. She would like to know what she  needs to do to help with this MD response: Her last appointment was 06/25/2020.  Prior to that appointment we had increased Abilify  from 5 mg to 10 mg without additional improvement.  Therefore the instructions were to reduce the Abilify  back to 5 mg daily.  She was not instructed to discontinue it. Because the increase in Abilify  had not been helpful from 5 to 10 mg, in addition to reducing the Abilify  back to 5 mg she was to increase the Lexapro  from 30 mg to 40 mg. It is unlikely that increasing Lexapro  would make her more depressed.  I think the depression is worse because she stopped the Abilify  apparently.  The  increase in Lexapro  could possibly make her more fatigued but worsening depression could be causing the fatigue.  The benefit from restarting Abilify  5 mg daily should be quick, that is benefit within a week. Therefore I suggest she continue the current dose of Lexapro  and add 5 mg of Abilify  back.  The depression should improve within a week or so.  If she is not better by February 10 then call us  back  11/10/2020 appt noted: Stayed 1 semester at [employer] not a good fit and left.  Goiing to [employer] in the fall. Felt worse off the Abilify  and restarted at 5 mg 08/12/20 and felt better energy. Doing pretty good, pretty busy.  Couple of classes at [employer] just to get some credit.s OCD is about the same without much change with increase in Lexapro .  No SE noted. Anxiety is fairly high usually associated with obsessive thoughts. Not too much depression. Sleep regular. NO SE.   Not regular Zofran  but occ and not sure what causes the nausea. Plan: Reduce Lexapro  to 20 ml and add 5 ml fluoxetine  for 1 week, Then reduce Lexapro  to 10 ml and increase fluoxetine  to 10 ml for 1 week, Then stop Lexapro  and increase fluoxetine  to 15 ml daily  02/16/2021 appointment with the following noted:  Seen with DAD Switched to fluoxetine  15 ml and energy and motivation and mood is better.  Anxiety is a little better but not b much.  OCD is about the same. No problems with the switch in meds.   Hard to tell how much time OCD takes bc she can mutifunciton while does compulsions. Avoids contaminants and handwashes but can work at [employer]. No SE.   Not really depressed.   Takes Zofran  intermittently a couple times per week can be triggered with caffeine. Father says she's seemed to be doing well and excited about school. Going to [employer] to [employer]. Less tremor. Plan: Option increase fluoxetine  to 80 mg daily for OCD.  She'd like to try it.  07/01/2021 appointment  with the following noted: Didn't increase bc fluoxetine  dose is the same. 60 mg daily. Been doing fairly well except for when off for several weeks.  When on it I feel good. No SE Don't like the quillivant  XR bc triggers intrusive SI and when coming off has depression and SI.  Not had this with other stimulants.  Takes it rarely. Still on aripiprazole  5 ml daily. No other SE. Mildly depressed.   Can have a good portion of time with OCD but doesn't always interfere with other things. Still nausea and not sure why.  Not caused by meds. When off Prozac  grades went down. Goes back to school Jan 11.  01/04/2022 appointment with the following noted: Psychiatric hospitalization 11/06/2021 until  11/12/2021 at [facility] in [place] for depression with suicidal ideation after drinking alcohol with a bottle of cough syrup and use of marijuana as well as noncompliance with psychiatric medications. Prescribed Abilify  5 mg daily along with fluoxetine  40 mg daily She's at home. She doesn't remember much about how she was doing then.  She had stopped meds a month or 2 before hosp but not sure why.  Before hosp was a few drinks 4 nights a week.  With drew from 1 class and passed the other 2.  Was smoking marijuana not daily but soemtimes multiple times per day.  Sometimes missed classes from oversleeping.  No opiates.   I think I might be bipolar bc was careening back and forth emotionally. Didn't sleep last night. Done some research on bipolar 2 including hyperactivity, with bursts of creativity and not sleeping. Also hypertalkative and hypersexual and risk taking and grandiose thinking.  Upswings might last days.  Mixed states also. Distinct depressions might last weeks.  Not much normal mood. Been at beach with friend last week.  No comments from family or friend. F thinks she might be bipolar. Has compulsions but not anxious about it. Handwashing a lot less than in the past. No panic and no avoidance. Had  anxiety about classes bc wasn't prepared enough. Swallowing is better now. Nausea for years without pattern. Consistent with meds. Consistent with Abilify  5 and fluoxetine  80 Plan: She elects to increase Abilify  to 10 mg daily.  01/22/22 appt noted: Increase Abilify  10 mg daily with SE mouth tics and reduced to 5 mg daily.  No one commented.  Moving around jaw would cause some pain soreness..  better on lower dose re: SE. Reduced just recently. Mood has been high and probably hypomanic or manic. Sometimes hears people knock on window or hears her name.  Weird. Staying up late and some EMA.  Avg 4-6 hours per night.  Some nights no sleep.   Mood and confidence and energy increase.  Always a bad spender, some increased risk taking behavior.  More talkative. Anxiety has been alright and not as bad as it was.   Nausea but no other sx physically. Plan: Trileptal  and increase to 150 mg every morning and 300 mg nightly.   Reduced fluxoeine to 60 DT rapid cycling Reduced Abilify  to 5 mg daily. Continue Vyvanse  30  02/12/2022 appointment with the following noted: Not taking Trilepotal, stopped Trileptal  DT increase SI so stopped.  Took it a few days. Still having some SI.  Not all the time but not too strong bc no plan.  If sees knife will have the thought.   Off Abilify . Has been depressed since after vacation.  Low energy and mood, little motivation.  Neg self talk. No irritability or anger. Sleep too much 10+ hours.  No napping. Not doing much.   At the beach was feeling fine.   Main stress, boredom and loneliness. Planning to return to school and feels capable.  Benefit structure and friends. Not much anxiety or OCD. Mouth tics resolved. Persistent N and lost 10# bc hard to eat. Plan: Start Lithobid  300 mg nightly x 4 days then 600 mg nightly.  03/19/22 appt noted: Sharing apt. Good with room mates. Start Lithobid  300 mg nightly x 4 days then 600 mg nightly.  No effect noted. No SI Up  and down with downs more noticeable.  Poor concept of time.  But days duration. With lows doesn't want to do anything. Only missed  2 classes.  Lays around when depressed. Sometimes can tell some mania and high energy and want to do so much stuff.SABRA  around the end of last week lasting 4-5 days. Not suire about mood now, can't identify how I'm feeling. Seeing out people and don't want to be alone.  Wouldn't say I'm not depressed. Not much anxiety. OCD not bad at all Rare trazodone . Worked with some hangover. nNausea is about the same. No cause known and has appt with doctor. Mouth tics much better off Abilify  Sleep irregular from 4-12 hours.  Sometimes don't feel like sleeping.    04/21/22 appt noted: Taking lithium  600 mg daily. Missed Vyvanse  and N is better.  Did help with ADD. Sleep is better but more than she would like. Willing to try increasing the lithium . She wants help with ADD but has not tolerated stimulants very well.  She is keeping up with class work for the most part and is not missing classes generally.  She needs to sleep more than she would like about 10 hours.  No major mood swings.  No suicidal thoughts since she was last here.  Anxiety is manageable but she does feel somewhat stressed by her schoolwork. Plan: Still rec increase  Lithobid  900 mg nightly. Check lithium  level.  09/22/22 appt noted: Made changes noted above.  So much better with change to daylight savings time.  But has had a lot of depression and some SI.  Really bad Nov-Dec .  Didn't go back to college.  Not good Jan & Feb.  March better with spring but not as good as she would like.  SI as recent as last week.  Can be triggered if feeling unwanted in social situations.  But can be triggered without reason.   Dep better on spring break with friends lately.  Overall dep 6-7/10 and anxiety 4/10 lately. No sig manic sx since here.   No sig SE.    Plan: Due to rapid cycling we have reduced the fluoxetine  to 40 mg  daily. Took 60 for 7 weeks.   Increase fluoxetine  to 60 mg daily for depression. Continue lihtium 900, modafinil  100-200 mg AM prn ADD sx., trazodone  50 mg HS prn  09/29/22 TC:  Patient was last seen on 3/13 and her fluoxetine  dose was increased. She is reporting frequent mood changes, said she was probably hypomanic, now describing increased depression, having anger outbursts which she said is not like her. She reports intermittent awakening at night, but is able to go back to sleep.    She also had an issue with not being able to get her modafinil .     MD resp: We need to stop the mood swings as top priority bc the depression will not be controlled as long as having mood swings.  Lithium  should be preventing the mood swings but is not and that may be due to blood level being too low.  I have requested her to get a blood level sometime ago but I have never received 1.  In order to speed the process up have her increase the lithium  to 4 tablets daily and get a blood level in a week.  The day before the blood test take the lithium  as normal and get the blood test approximately 12 hours after the last dose of lithium .  It is very important that she get that level so we can determine whether lithium  is going to work for her or not.  But she needs to  wait 5 to 7 days after increasing the lithium  to 4 tablets daily before getting the blood level. Ill send lihtium level order to [facility].     10/25/22 TC:  Pt called 1:04pm today. [facility] and they took bloodwork on her but think she had lithium  toxicity. She went to [facility] in [facility]. She was taking 1200mg  of lithium  right now. She is not sure what to do and lab results haven't come back yet. (Shakiness, Unsteady, Dizzy, cloudy, Vision blurred, stomach upset , fatigued, body aches)    MD resp:  RTC  Lithium  level 1.1.  stopped a couple of days ago.  Complaining of sx starting Thursday.   No change in sx or meds.  Was  feeling fatigued.   Drinking fluids now.   Stopped lithium  Saturday.   Stay off lithium  for a couple of days and then call back when sx resolve.  No other changes.   No SI lately.    She agrees. Lorene Macintosh, MD, DFAPA     11/02/22 appt noted: Stopped lithium  on weekend.  Restarted at 300 mg daily. Feels a little shakey, nausea, mild dizzy feeling.  Mild tremor. No med changes or additions last week.  Eating and drinking normally. Over the last month mood is pretty good overall.   No SI in the last month. She has felt physically anxious and can't place why.  Not much OCD right now. More energy and focus in the day with modafinil . No NSAIDs. Lithium  does seem to work and would like to continue it.   Had some tremor before lithium .  Uncertain if family hisotry of it. Plan; no changes  11/12/22 appt noted: Overall N is better and less Zofran . Propranolol  prn anxiety Normal amount of tremor and nausea.  No longer unsteady.   Mentally ok.   Alyse Domino living with them and that has helped mood. Uncertain future.  Goal is to save up enough for apt.  But friend may be there a long while.  Met first year college of [employer]. Not lonely or bored all the time and those are the things that really get me.   No SI since here. Nothing terribly excessive anxiety but some residual OCD is the main source of the anxiety. No SE now.   Asks about increasing lithium . Plan: Modafinil  off label DT poor tolerance stimulants, modafinil  100 mg AM.    03/24/23 appt noted:  goes by Kim [name] Couple NS since here Meds: fluoxetine  60 mg for couple mos, modafinil  200 mg AM, lithobid  600 Modafinil  helped focus and attention.   No SE Increased fluoxetine  to 60. Not bad mood but disconnected. No usual stress.   At home.  Starting new job FT early October.  Excited. Cut leg since here and had to get stitches middle August.  Dont' remember how she was feeling at the time.  No SI.  No sig SI since here.  Losing  time and can't recall wht she did yesterday.  Alcohol 3-4 days per week 2-3 beers and on weekend more.  Occ THC. No other drugs.   No NM.  Weird dreams and thinks their repetitive.  Sleep about 9 hours.   Not much obsessive thinking. No sig trauma history.  Not much other anxiety.   N is better. Plan retry Wellbutrin  with other meds for mood , anhedonia  07/28/23 appt noted: with mother, Kim [name] Psych meds: stopped Wellbutrin  XL 300 every morning, incre fluoxetine  80 daily,  Lithobid  600 daily, off modafinil  200 every morning bc didn't get a RF, propranolol  10 mg 1-2 twice daily as needed anxiety or tremor. Kim [name] reports stopped Wellbutrin  DT N, dizziness, sleepiness. Last month incr fluoxetine  to 80 mg on her own for depression.  Winter typically low energy.  Affects function.   Also depressed.  Not much effect with increase fluoxetine . No current SE. Having joint pain all the time.  Esp if in one position too long.   Sleep 9-10 hours interrupted.  Sleep more in winter.   M notes for several months withdrawn and hard to converse.  Will isolate in her room.  Sees her with hopelessness.   Discouraged over not finding a job.  Lack of energy and motivation.  No self harm.   F benefit pramipexole. Plan: continue meds.  Resume modafinil . Vraylar  1.5 mg daily for bipolar depression.  samples  08/24/23 appt noted: Psych med: fluox 60, lithobid  600 HS, modafinil  200 AM, propranolol  10-20 mg BID prn , Vraylar  1.5 mg daily. Higher energy with Vraylar .  More productive.  Better mood. Seasonal dep.  Hard to say if out of depression therefeore. No SE with Vraylar . Less sleep to 7-8 hours.  Some awakening with pain.  Not drowsy.   Satisfied with meds now but ready for Spring. Plan no changes  09/28/23 appt noted: Med: fluox 60, lithobid  600 HS, modafinil  200 AM, propranolol  10-20 mg BID prn , OUT Vraylar  1.5 mg daily for a few weeks Has felt kind of numb lately.  Annoying.  Harder to focus and  motivate without it.   No SE with Vraylar .   Sleep inconsistent either too much or too lately.  Kind of cyclical . No sig anxiety. Applying for jobs locally.   Plan: Ran out of Vraylar  1.5 mg daily for bipolar depression.  Will restart it today bc prior benefit of dep  12/08/23 appt noted; Med: fluox 60, lithobid  600 HS, modafinil  200 AM, propranolol  10-20 mg BID prn helps heart flutter and tremor, Vraylar  1.5 mg daily  Fell and fx foot.  Will wear boot for 3 mos. Good spot emotionally.   More energy and motivation back on Vraylar . No SE except mild tremor.   Off and on caffeine. Sleep pretty good.   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   Modafinil  helps focus and alertness.   No concerns with meds. No sig dep resolves quickly and no SI  03/13/24 NS  04/30/24 appt noted:  Kim [name] alone Med: fluox 60, lithobid  600 HS, modafinil  200 AM, propranolol  10-20 mg BID prn helps heart flutter and tremor, Vraylar  1.5 mg daily  Unmotivated with random bursts.  Wonders about going back on Vyvanse .  It helped.  Dx spondyloarthritis and the med is causing some wt gain. Feels stuck in life and hard to do anything new.  Not working anywhere bc coffee shop job bc broke foot.  Healed.   Wants ajob somewhere. Mood somewhat up and down with wide range.  Not sure how she will wake up.  Irritability on both ends of spectrum.  On up day has to be doing something.  On down days just watching TV. Usually a few days with ups and down swings.   No sig sleep changes.   No SE noted. Plan; She wants to retry Vyvanse  20.   Increase Vraylar  3 mg daily for bipolar depression bc mood cycling.  07/03/24 appt noted; Kim [name] alone Med: fluox 60, lithobid  600 HS, rare propranolol   10-20 mg BID prn helps heart flutter and tremor, Vraylar  3 mg daily , Vyvanse  20 mg  More energy, and motivation and less dep. SE no N, not other except mild tremor Not dep.  And no SI. Anxiety driving and takes propranolol  prn. No new concerns  with health or otherwise. No success finding a job so far.  Some anxiety about this both not having and getting one.    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 Previous Kim [name] without help.  GF OCD. F bipolar lithium  with benefit.  Past Psychiatric Medication Trials: History of Tourette's, ADD, OCD History of ADD brief trials Adderall XR 15, guanfacine ER 1 tired, methylphenidate , Strattera  Quillevent SI Vyvanse  N Modafinil  100 tolerated. Lithium  900 tolerated; 1200 SE Lexapro  40, sertraline liquid brief hard to swallow,   Fluoxetine  60 better mood Wellbutrin  SE mirtazapine,   Abilify  10 EPS mouth tics & no better than 5 which helped. Vraylar  1.5 Trileptal  brief increase SI  Psychiatric hospitalization 11/06/2021 until 11/12/2021 at [facility] in [place] for depression with suicidal ideation after drinking alcohol with a bottle of cough syrup and use of marijuana as well as noncompliance with psychiatric medications. Prescribed Abilify  5 mg daily along with fluoxetine  40 mg daily. Had EPS with increased Abilify  to 10 mg daily.   Stopped DT mouth tics.  Review of Systems:  Review of Systems  Cardiovascular:  Negative for palpitations.  Gastrointestinal:  Negative for nausea.  Musculoskeletal:  Positive for arthralgias and joint swelling.  Neurological:  Negative for tremors.  Psychiatric/Behavioral:  Negative for dysphoric mood and sleep disturbance.     Medications: I have reviewed the patient's current medications.  Current Outpatient Medications  Medication Sig Dispense Refill   Certolizumab Pegol  (CIMZIA  [place]) Inject into the skin.     [START ON 07/31/2024] lisdexamfetamine  (VYVANSE ) 20 MG capsule Take 1 capsule (20 mg total) by mouth daily. 30 capsule 0   [START ON 08/28/2024] lisdexamfetamine  (VYVANSE ) 20 MG capsule Take 1 capsule (20 mg total) by mouth daily. 30 capsule 0   lithium  carbonate (LITHOBID ) 300 MG ER tablet Take 2 tablets (600 mg total) by  mouth daily. 180 tablet 1   ondansetron  (ZOFRAN -ODT) 8 MG disintegrating tablet TAKE 1 TABLET BY MOUTH EVERY 8 HOURS AS NEEDED FOR NAUSEA AND VOMITING 20 tablet 0   propranolol  (INDERAL ) 10 MG tablet TAKE 1 TO 2 TABLETS BY MOUTH TWICE A DAY AS NEEDED FOR ANXIETY OR TREMOR 360 tablet 1   cariprazine  (VRAYLAR ) 3 MG capsule Take 1 capsule (3 mg total) by mouth daily. 90 capsule 0   FLUoxetine  (PROZAC ) 20 MG capsule Take 3 capsules (60 mg total) by mouth daily. 270 capsule 1   lisdexamfetamine  (VYVANSE ) 20 MG capsule Take 1 capsule (20 mg total) by mouth daily. 30 capsule 0   traZODone  (DESYREL ) 50 MG tablet Take 1 tablet (50 mg total) by mouth at bedtime as needed for sleep. (Patient not taking: Reported on 07/03/2024) 30 tablet 0   No current facility-administered medications for this visit.    Medication Side Effects: None  Allergies: No Known Allergies  Past Medical History:  Diagnosis Date   Constipation    GERD (gastroesophageal reflux disease)    Granuloma of skin    on forehead    Family History  Problem Relation Age of Onset   GER disease Father    Ulcerative colitis Father    ADD / ADHD Father    Anxiety disorder Father    Depression Father  Bipolar disorder Father    Migraines Paternal Grandmother    Anxiety disorder Paternal Grandmother    Depression Paternal Grandmother    Seizures Neg Hx    Autism Neg Hx    Schizophrenia Neg Hx     Social History   Socioeconomic History   Marital status: Single    Spouse name: n/a   Number of children: 0   Years of education: Not on file   Highest education level: Not on file  Occupational History   Occupation: student  Tobacco Use   Smoking status: Never   Smokeless tobacco: Never  Vaping Use   Vaping status: Never Used  Substance and Sexual Activity   Alcohol use: No   Drug use: No   Sexual activity: Never  Other Topics Concern   Not on file  Social History Narrative   Lives with mom, dad, brother in the same  house. She is in the 11th grade at [employer]. Does well in school. She enjoys listening to music, writing and singing.    Social Drivers of Health   Tobacco Use: Low Risk (07/03/2024)   Patient History    Smoking Tobacco Use: Never    Smokeless Tobacco Use: Never    Passive Exposure: Not on file  Financial Resource Strain: Not on file  Food Insecurity: Not on file  Transportation Needs: Not on file  Physical Activity: Not on file  Stress: Not on file  Social Connections: Not on file  Intimate Partner Violence: Not At Risk (02/24/2023)   Received from [facility]   HITS    Over the last 12 months how often did your partner physically hurt you?: Never    Over the last 12 months how often did your partner insult you or talk down to you?: Never    Over the last 12 months how often did your partner threaten you with physical harm?: Never    Over the last 12 months how often did your partner scream or curse at you?: Never  Depression (PHQ2-9): Not on file  Alcohol Screen: Not on file  Housing: Not on file  Utilities: Not on file  Health Literacy: Not on file    Past Medical History, Surgical history, Social history, and Family history were reviewed and updated as appropriate.   Please see review of systems for further details on the patient's review from today.   Objective:   Physical Exam:  There were no vitals taken for this visit.  Physical Exam Constitutional:      General: She is not in acute distress.    Appearance: She is well-developed.  Musculoskeletal:        General: No deformity.  Neurological:     Mental Status: She is alert and oriented to person, place, and time.     Cranial Nerves: No dysarthria.     Coordination: Coordination normal.  Psychiatric:        Attention and Perception: Attention and perception normal.        Mood and Affect: Mood is not anxious or depressed. Affect is not labile, blunt, angry or inappropriate.        Speech: Speech normal.         Behavior: Behavior normal. Behavior is cooperative.        Thought Content: Thought content normal. Thought content is not paranoid or delusional. Thought content does not include homicidal or suicidal ideation. Thought content does not include suicidal plan.  Cognition and Memory: Cognition and memory normal.        Judgment: Judgment normal.     Comments: Insight intact Anxiety manageable  Depression less anhedonia on Vraylar   stopped cycling.      Lab Review:  No results found for: NA, K, CL, CO2, GLUCOSE, BUN, CREATININE, CALCIUM , PROT, ALBUMIN, AST, ALT, ALKPHOS, BILITOT, GFRNONAA, GFRAA  No results found for: WBC, RBC, HGB, HCT, PLT, MCV, MCH, MCHC, RDW, LYMPHSABS, MONOABS, EOSABS, BASOSABS  No results found for: POCLITH, LITHIUM    No results found for: PHENYTOIN, PHENOBARB, VALPROATE, CBMZ   Review [facility] testing dated September 30, 2017.  SLC 6 A4 was long long.  HDR2A was normal genotype. Pharmacokinetic genes included normal 2D6, 3 A4, 2 C9, 2C19, 1A2, and intermediate 2B 6  10/23/22 Lithium  level 1.1. while taking 1200 mg day but had   .res Assessment: Plan:    Maylea Kim [name] was seen today for follow-up, depression and anxiety.  Diagnoses and all orders for this visit:  Bipolar affective disorder, rapid cycling (HCC) -     cariprazine  (VRAYLAR ) 3 MG capsule; Take 1 capsule (3 mg total) by mouth daily.  Mixed obsessional thoughts and acts -     FLUoxetine  (PROZAC ) 20 MG capsule; Take 3 capsules (60 mg total) by mouth daily.  Attention deficit hyperactivity disorder (ADHD), combined type, moderate -     lisdexamfetamine  (VYVANSE ) 20 MG capsule; Take 1 capsule (20 mg total) by mouth daily. -     lisdexamfetamine  (VYVANSE ) 20 MG capsule; Take 1 capsule (20 mg total) by mouth daily. -     lisdexamfetamine  (VYVANSE ) 20 MG capsule; Take 1 capsule (20 mg total) by mouth daily.  Tourette's  disorder  Tremor, unspecified  Lithium  use      30 min face to face time with patient . SX overall undergood control back on Vraylar .  Less health problems.  Discussed  bipolar disorder type II, recent hypomania/ dep cycling.  She has done some reading on the subject.  That appears to be accurate.  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  We discussed how the presence of SSRIs used for her OCD may increase the risk of rapid cycling.  However there is not a good alternative to SSRIs for OCD. Extensive discussion about alternative types of mood stabilizers including lithium , Depakote and atypical antipsychotics.  Lithium  is not reliably effective for rapid cycling but father is bipolar with benefit from lithium .    Due to the suicidal thoughts lithium  was a good choice and SI resoflved. But didn't stop all the cylcing. SABRA  Her father also responded to lithium .  Informed her we could go up in the dose if she starts developing manic symptoms.  We discussed again the importance of the mood stabilizer.  We will use Lithobid  because of nausea problems. Icontinue lower dose  Lithobid  600 mg nightly  Need  lithium  level.  Not gotten as requested.  Counseled patient regarding potential benefits, risks, and side effects of lithium  to include potential risk of lithium  affecting thyroid  and renal function.  Discussed need for periodic lab monitoring to determine drug level and to assess for potential adverse effects.  Counseled patient regarding signs and symptoms of lithium  toxicity and advised that they notify office immediately or seek urgent medical attention if experiencing these signs and symptoms.  Patient advised to contact office with any questions or concerns. sx lithium  toxicity resolved.  For mood  fluoxetine  to 60 mg daily for  depression and she's continued it.      She has learned to swallow capsules while at the hospital.  We discussed the risk of worsening OCD at  the lower dose  Coffee worsens tremor discussed and she's aware.  Better with retry Vyvanse  20.    Better with Increase Vraylar  3 mg daily for bipolar depression bc mood cycling. Discussed potential metabolic side effects associated with atypical antipsychotics, as well as potential risk for movement side effects. Advised pt to contact office if movement side effects occur.  This has the potential of being monotherapy.  Disc other options.   Discussed the importance of avoiding marijuana and alcohol abuse at this time  We discussed her father's psychiatric meds and the potential for use of lamotrigine.  We will we will defer this choice at this time in order to keep medications as simple as possible  vitamin D 4000-5000 units daily in winter.  Disc counseling  Consider lamotrigine for seasonal depression  Call if symptoms worsen or she has significant side effects.  FU 3 mos  Lorene Macintosh, MD, DFAPA  Please see After Visit Summary for patient specific instructions.  Future Appointments  Date Time Provider Department Center  10/01/2024  2:00 PM Cottle, Lorene KANDICE Raddle., MD [facility] None            No orders of the defined types were placed in this encounter.    -------------------------------

## 2024-10-01 ENCOUNTER — Ambulatory Visit: Admitting: Psychiatry
